# Patient Record
Sex: Male | Born: 1970 | Race: Asian | Hispanic: No | Marital: Married | Smoking: Never smoker
Health system: Southern US, Community
[De-identification: ages and names within clinical notes are randomized; demographics above are authoritative.]

## PROBLEM LIST (undated history)

## (undated) DIAGNOSIS — J301 Allergic rhinitis due to pollen: Secondary | ICD-10-CM

## (undated) DIAGNOSIS — I10 Essential (primary) hypertension: Secondary | ICD-10-CM

## (undated) DIAGNOSIS — E785 Hyperlipidemia, unspecified: Secondary | ICD-10-CM

## (undated) HISTORY — PX: WISDOM TOOTH EXTRACTION: SHX21

## (undated) HISTORY — DX: Essential (primary) hypertension: I10

## (undated) HISTORY — DX: Allergic rhinitis due to pollen: J30.1

## (undated) HISTORY — DX: Hyperlipidemia, unspecified: E78.5

---

## 2008-01-22 ENCOUNTER — Ambulatory Visit: Payer: Self-pay | Admitting: Family Medicine

## 2008-01-22 DIAGNOSIS — E785 Hyperlipidemia, unspecified: Secondary | ICD-10-CM | POA: Insufficient documentation

## 2008-01-22 DIAGNOSIS — IMO0002 Reserved for concepts with insufficient information to code with codable children: Secondary | ICD-10-CM

## 2008-01-22 DIAGNOSIS — R29898 Other symptoms and signs involving the musculoskeletal system: Secondary | ICD-10-CM

## 2008-01-22 DIAGNOSIS — I10 Essential (primary) hypertension: Secondary | ICD-10-CM | POA: Insufficient documentation

## 2008-01-22 DIAGNOSIS — K8020 Calculus of gallbladder without cholecystitis without obstruction: Secondary | ICD-10-CM | POA: Insufficient documentation

## 2008-02-27 ENCOUNTER — Ambulatory Visit: Payer: Self-pay | Admitting: Family Medicine

## 2008-02-27 DIAGNOSIS — M25559 Pain in unspecified hip: Secondary | ICD-10-CM | POA: Insufficient documentation

## 2008-02-27 LAB — CONVERTED CEMR LAB: LDL Cholesterol: 97 mg/dL

## 2008-03-01 LAB — CONVERTED CEMR LAB
Albumin: 3.9 g/dL (ref 3.5–5.2)
BUN: 15 mg/dL (ref 6–23)
Calcium: 9.2 mg/dL (ref 8.4–10.5)
Direct LDL: 96.1 mg/dL
GFR calc Af Amer: 98 mL/min
Glucose, Bld: 102 mg/dL — ABNORMAL HIGH (ref 70–99)
HDL: 38.8 mg/dL — ABNORMAL LOW (ref >39.0)
Total Protein: 7.3 g/dL (ref 6.0–8.3)
Triglycerides: 307 mg/dL (ref 0–149)
VLDL: 61 mg/dL — ABNORMAL HIGH (ref 0–40)

## 2008-03-05 ENCOUNTER — Telehealth (INDEPENDENT_AMBULATORY_CARE_PROVIDER_SITE_OTHER): Payer: Self-pay | Admitting: *Deleted

## 2008-04-03 ENCOUNTER — Ambulatory Visit: Payer: Self-pay | Admitting: Family Medicine

## 2008-04-03 DIAGNOSIS — J069 Acute upper respiratory infection, unspecified: Secondary | ICD-10-CM | POA: Insufficient documentation

## 2008-09-05 ENCOUNTER — Ambulatory Visit: Payer: Self-pay | Admitting: Family Medicine

## 2008-09-05 LAB — CONVERTED CEMR LAB
ALT: 33 units/L (ref 0–53)
AST: 25 units/L (ref 0–37)
Alkaline Phosphatase: 49 units/L (ref 39–117)
Bilirubin, Direct: 0 mg/dL (ref 0.0–0.3)
Total Protein: 7.4 g/dL (ref 6.0–8.3)

## 2009-09-30 ENCOUNTER — Ambulatory Visit: Payer: Self-pay | Admitting: Family Medicine

## 2009-09-30 LAB — CONVERTED CEMR LAB
ALT: 23 units/L (ref 0–53)
AST: 21 units/L (ref 0–37)
Albumin: 4.1 g/dL (ref 3.5–5.2)
Alkaline Phosphatase: 53 units/L (ref 39–117)
Basophils Relative: 0.5 % (ref 0.0–3.0)
CO2: 29 meq/L (ref 19–32)
Calcium: 9.5 mg/dL (ref 8.4–10.5)
Chloride: 100 meq/L (ref 96–112)
Eosinophils Absolute: 0.1 10*3/uL (ref 0.0–0.7)
Hemoglobin: 14.6 g/dL (ref 13.0–17.0)
Lymphocytes Relative: 39.1 % (ref 12.0–46.0)
MCHC: 33.9 g/dL (ref 30.0–36.0)
Neutro Abs: 3.4 10*3/uL (ref 1.4–7.7)
RBC: 4.98 M/uL (ref 4.22–5.81)
Sodium: 139 meq/L (ref 135–145)
Total CHOL/HDL Ratio: 5
Total Protein: 7.3 g/dL (ref 6.0–8.3)
Triglycerides: 207 mg/dL — ABNORMAL HIGH (ref 0.0–149.0)

## 2009-10-06 ENCOUNTER — Ambulatory Visit: Payer: Self-pay | Admitting: Family Medicine

## 2009-11-04 ENCOUNTER — Encounter: Payer: Self-pay | Admitting: Family Medicine

## 2010-01-21 ENCOUNTER — Ambulatory Visit: Payer: Self-pay | Admitting: Internal Medicine

## 2010-01-21 DIAGNOSIS — B309 Viral conjunctivitis, unspecified: Secondary | ICD-10-CM | POA: Insufficient documentation

## 2010-06-02 NOTE — Miscellaneous (Signed)
  Clinical Lists Changes  Medications: Added new medication of FLONASE 50 MCG/ACT SUSP (FLUTICASONE PROPIONATE) 2 sprays in each nostril once daily - Signed Rx of FLONASE 50 MCG/ACT SUSP (FLUTICASONE PROPIONATE) 2 sprays in each nostril once daily;  #3 x 3;  Signed;  Entered by: Benny Lennert CMA (AAMA);  Authorized by: Hannah Beat MD;  Method used: Electronically to [facility] [address], [place], [place], [place]  [address], Ph: [phone], Fax: [phone]    Prescriptions: FLONASE 50 MCG/ACT SUSP (FLUTICASONE PROPIONATE) 2 sprays in each nostril once daily  #3 x 3   Entered by:   Benny Lennert CMA (AAMA)   Authorized by:   Hannah Beat MD   Signed by:   Benny Lennert CMA (AAMA) on 11/04/2009   Method used:   Electronically to        [facility] [address] (retail)       [address], [address]       [place], [place]  [address]       Ph: [phone]       Fax: [phone]   RxID:   [id number]   Prior Medications: PRAVASTATIN SODIUM 80 MG  TABS (PRAVASTATIN SODIUM) one by mouth at bedtime AMLODIPINE BESYLATE 10 MG TABS (AMLODIPINE BESYLATE) 1 by mouth daily Current Allergies: ! HYDROCODONE ACE INHIBITORS

## 2010-06-02 NOTE — Assessment & Plan Note (Signed)
Summary: ?SINUS INFECTION,PINK EYE/CLE   Vital Signs:  Patient profile:   40 year old male Weight:      210 pounds Temp:     98.3 degrees F oral Pulse rate:   96 / minute Pulse rhythm:   regular BP sitting:   124 / 80  (left arm) Cuff size:   large  Vitals Entered By: Selena Batten Dance CMA Duncan Dull) (January 21, 2010 12:31 PM) CC: ? Sinus Infection/? Pink eye   History of Present Illness: CC: feeling bad this morning  3d h/o Red left eye, this am swollen, discharge.  Also feeling lots of drainage down back of throat.  Not feeling well.  + cough, productive.  Possible fever last night - awoke sweating.  Mild diarrhea.  Taking OTC pseudophed. This AM took advil.  Worse in am and at night.    No abd pain, n/v.  No h/o asthma.  No smokers at home.  + 23 year old son, in daycare.  pink eye there.  Current Medications (verified): 1)  Pravastatin Sodium 80 Mg  Tabs (Pravastatin Sodium) .... One By Mouth At Bedtime 2)  Amlodipine Besylate 10 Mg Tabs (Amlodipine Besylate) .Marland Kitchen.. 1 By Mouth Daily 3)  Flonase 50 Mcg/act Susp (Fluticasone Propionate) .... 2 Sprays in Each Nostril Once Daily  Allergies: 1)  ! Hydrocodone 2)  Ace Inhibitors  Past History:  Past Medical History: elevated BP Hyperlipidemia Low back pain Piriformis syndrome Hypertension Allergic rhinitis  Review of Systems       per HPI  Physical Exam  General:  Well-developed,well-nourished,in no acute distress; alert,appropriate and cooperative throughout examination Head:  Normocephalic and atraumatic without obvious abnormalities. No apparent alopecia or balding. Eyes:  No corneal or conjunctival inflammation noted. EOMI. Perrla. Vision grossly normal. Ears:  External ear exam shows no significant lesions or deformities.  Otoscopic examination reveals clear canals, tympanic membranes are intact bilaterally without bulging, retraction, inflammation or discharge. Hearing is grossly normal bilaterally. Nose:  External  nasal examination shows no deformity or inflammation. Nasal mucosa are pink and moist without lesions or exudates. Mouth:  erythematous pharynx Neck:  No deformities, masses, or tenderness noted. Lungs:  Normal respiratory effort, chest expands symmetrically. Lungs are clear to auscultation, no crackles or wheezes. Heart:  Normal rate and regular rhythm. S1 and S2 normal without gallop, murmur, click, rub or other extra sounds. Abdomen:  Bowel sounds positive,abdomen soft and non-tender without masses, organomegaly or hernias noted. Pulses:  2+ rad pulses Extremities:  no edema Skin:  Intact without suspicious lesions or rashes   Impression & Recommendations:  Problem # 1:  URI (ICD-465.9) Instructed on symptomatic treatment. Call if symptoms persist or worsen.   NSAIDs for throat, guaifenesin to help cough up mucous.  Expected course of illness discussed, red flags to return discussed.  Problem # 2:  CONJUNCTIVITIS, VIRAL, LEFT (ICD-077.99) symptomatic treatment, warm compresses, return if not improving.  Complete Medication List: 1)  Pravastatin Sodium 80 Mg Tabs (Pravastatin sodium) .... One by mouth at bedtime 2)  Amlodipine Besylate 10 Mg Tabs (Amlodipine besylate) .Marland Kitchen.. 1 by mouth daily 3)  Flonase 50 Mcg/act Susp (Fluticasone propionate) .... 2 sprays in each nostril once daily  Patient Instructions: 1)  Sounds like you have a viral upper respiratory infection. 2)  Antibiotics are not needed for this.  Viral infections usually take 7-10 days to resolve.  The cough can last up to 4-6 weeks to go away. 3)  For eye - warm compresses 3 times  a day. 4)  Push fluids and rest. 5)  Guaifenesin 400mg  IR 1 1/2 pills in am and at noon, take with plenty of fluid.   6)  Please return if you are not improving as expected, or if you have high fevers (>101.5) or difficulty swallowing. 7)  Call clinic with questions.  Pleasure to see you today!   Current Allergies (reviewed today): !  HYDROCODONE ACE INHIBITORS

## 2010-06-02 NOTE — Assessment & Plan Note (Signed)
Summary: CPX/CLE   Vital Signs:  Patient profile:   40 year old male Height:      68 inches Weight:      218.0 pounds BMI:     33.27 Temp:     98.7 degrees F oral Pulse rate:   68 / minute Pulse rhythm:   regular BP sitting:   138 / 80  (left arm) Cuff size:   large  Vitals Entered By: Benny Lennert CMA Duncan Dull) (October 06, 2009 2:30 PM)  History of Present Illness: Chief complaint cpx  40 year old male:  intermittent constipation and diarrhea having some gi issues, stomache ache  constipated for a a long time on and off for a year.   ACE COUGH  Preventive Screening-Counseling & Management  Alcohol-Tobacco     Alcohol drinks/day: <1     Alcohol Counseling: not indicated; use of alcohol is not excessive or problematic     Smoking Status: never     Tobacco Counseling: not indicated; no tobacco use  Caffeine-Diet-Exercise     Diet Comments: poor     Diet Counseling: to improve diet; diet is suboptimal     Does Patient Exercise: yes     Exercise Counseling: to improve exercise regimen  Hep-HIV-STD-Contraception     HIV Risk: no risk noted     STD Risk: no risk noted     Testicular SE Education/Counseling to perform regular STE      Sexual History:  currently monogamous.        Drug Use:  never.    Clinical Review Panels:  Lipid Management   Cholesterol:  200 (09/30/2009)   LDL (bad choesterol):  97 (02/27/2008)   HDL (good cholesterol):  44.20 (09/30/2009)  CBC   WBC:  6.6 (09/30/2009)   RBC:  4.98 (09/30/2009)   Hgb:  14.6 (09/30/2009)   Hct:  42.9 (09/30/2009)   Platelets:  194.0 (09/30/2009)   MCV  86.2 (09/30/2009)   MCHC  33.9 (09/30/2009)   RDW  13.4 (09/30/2009)   PMN:  51.0 (09/30/2009)   Lymphs:  39.1 (09/30/2009)   Monos:  7.7 (09/30/2009)   Eosinophils:  1.7 (09/30/2009)   Basophil:  0.5 (09/30/2009)  Complete Metabolic Panel   Glucose:  92 (09/30/2009)   Sodium:  139 (09/30/2009)   Potassium:  4.1 (09/30/2009)   Chloride:  100  (09/30/2009)   CO2:  29 (09/30/2009)   BUN:  22 (09/30/2009)   Creatinine:  1.0 (09/30/2009)   Albumin:  4.1 (09/30/2009)   Total Protein:  7.3 (09/30/2009)   Calcium:  9.5 (09/30/2009)   Total Bili:  0.7 (09/30/2009)   Alk Phos:  53 (09/30/2009)   SGPT (ALT):  23 (09/30/2009)   SGOT (AST):  21 (09/30/2009)   Allergies: 1)  ! Hydrocodone 2)  Ace Inhibitors  Past History:  Past medical, surgical, family and social histories (including risk factors) reviewed, and no changes noted (except as noted below).  Past Medical History: Reviewed history from 02/27/2008 and no changes required. elevated BP Hyperlipidemia Low back pain Piriformis syndrome Hypertension  Family History: Reviewed history from 01/22/2008 and no changes required. Adopted  Social History: Reviewed history from 01/22/2008 and no changes required. Married Wife pregnant Works at [employer], PTA No tob Alcohol use-yes Drug use-no Regular exercise-yes Smoking Status:  never STD Risk:  no risk noted HIV Risk:  no risk noted Sexual History:  currently monogamous Drug Use:  never  Review of Systems  General: Denies fever, chills,  sweats, anorexia, fatigue, weakness, malaise Eyes: Denies blurring, vision loss ENT: Denies earache, nasal congestion, nosebleeds, sore throat, and hoarseness.  Cardiovascular: Denies chest pains, palpitations, syncope, dyspnea on exertion,  Respiratory: Denies cough, dyspnea at rest, excessive sputum,wheeezing GI: AS ABOVE GU: Denies dysuria, hematuria, discharge, urinary frequency, urinary hesitancy, nocturia, incontinence, genital sores, decreased libido Musculoskeletal: Denies back pain, joint pain Derm: Denies rash, itching Neuro: Denies  paresthesias, frequent falls, frequent headaches, and difficulty walking.  Psych: Denies depression, anxiety Endocrine: Denies cold intolerance, heat intolerance, polydipsia, polyphagia, polyuria, and unusual weight change.  Heme:  Denies enlarged lymph nodes Allergy: No hayfever   Otherwise, the pertinent positives and negatives are listed above and in the HPI, otherwise a full review of systems has been reviewed and is negative unless noted positive.   Physical Exam  General:  Well-developed,well-nourished,in no acute distress; alert,appropriate and cooperative throughout examination Head:  Normocephalic and atraumatic without obvious abnormalities. No apparent alopecia or balding. Eyes:  No corneal or conjunctival inflammation noted. EOMI. Perrla. Vision grossly normal. Ears:  External ear exam shows no significant lesions or deformities.  Otoscopic examination reveals clear canals, tympanic membranes are intact bilaterally without bulging, retraction, inflammation or discharge. Hearing is grossly normal bilaterally. Nose:  External nasal examination shows no deformity or inflammation. Nasal mucosa are pink and moist without lesions or exudates. Mouth:  Oral mucosa and oropharynx without lesions or exudates.  Teeth in good repair. Neck:  No deformities, masses, or tenderness noted. Chest Wall:  No deformities, masses, tenderness or gynecomastia noted. Lungs:  Normal respiratory effort, chest expands symmetrically. Lungs are clear to auscultation, no crackles or wheezes. Heart:  Normal rate and regular rhythm. S1 and S2 normal without gallop, murmur, click, rub or other extra sounds. Abdomen:  Bowel sounds positive,abdomen soft and non-tender without masses, organomegaly or hernias noted. Genitalia:  Testes bilaterally descended without nodularity, tenderness or masses. No scrotal masses or lesions. No penis lesions or urethral discharge. Msk:  normal ROM and no crepitation.   Extremities:  No clubbing, cyanosis, edema, or deformity noted with normal full range of motion of all joints.   Neurologic:  alert & oriented X3 and gait normal.   Skin:  Intact without suspicious lesions or rashes Cervical Nodes:  No  lymphadenopathy noted Psych:  Cognition and judgment appear intact. Alert and cooperative with normal attention span and concentration. No apparent delusions, illusions, hallucinations   Impression & Recommendations:  Problem # 1:  HEALTH MAINTENANCE EXAM (ICD-V70.0) The patient's preventative maintenance and recommended screening tests for an annual wellness exam were reviewed in full today. Brought up to date unless services declined.  Counselled on the importance of diet, exercise, and its role in overall health and mortality. The patient's FH and SH was reviewed, including their home life, tobacco status, and drug and alcohol status.   Problem # 2:  HYPERTENSION (ICD-401.9) change to generic norvasc - probable constipation from verapamil  The following medications were removed from the medication list:    Verapamil Hcl Cr 360 Mg Cp24 (Verapamil hcl) .Marland Kitchen... 1 by mouth daily His updated medication list for this problem includes:    Amlodipine Besylate 10 Mg Tabs (Amlodipine besylate) .Marland Kitchen... 1 by mouth daily  Complete Medication List: 1)  Pravastatin Sodium 80 Mg Tabs (Pravastatin sodium) .... One by mouth at bedtime 2)  Amlodipine Besylate 10 Mg Tabs (Amlodipine besylate) .Marland Kitchen.. 1 by mouth daily Prescriptions: AMLODIPINE BESYLATE 10 MG TABS (AMLODIPINE BESYLATE) 1 by mouth daily  #90 x 3  Entered and Authorized by:   Hannah Beat MD   Signed by:   Hannah Beat MD on 10/06/2009   Method used:   Print then Give to Patient   RxID:   [id number] PRAVASTATIN SODIUM 80 MG  TABS (PRAVASTATIN SODIUM) one by mouth at bedtime  #90 x 3   Entered and Authorized by:   Hannah Beat MD   Signed by:   Hannah Beat MD on 10/06/2009   Method used:   Electronically to        [facility] [address] ([facility]ail)       [address], [address]       [place], [place]  [address]       Ph: [phone]       Fax: [phone]   RxID:    [id number] AMLODIPINE BESYLATE 10 MG TABS (AMLODIPINE BESYLATE) 1 by mouth daily  #30 x 11   Entered and Authorized by:   Hannah Beat MD   Signed by:   Hannah Beat MD on 10/06/2009   Method used:   Electronically to        [facility] [address] (retail)       [address], [address]       [place], [place]  [address]       Ph: [phone]       Fax: [phone]   RxID:   [id number]   Current Allergies (reviewed today): ! HYDROCODONE ACE INHIBITORS

## 2010-10-26 ENCOUNTER — Other Ambulatory Visit: Payer: Self-pay | Admitting: Family Medicine

## 2010-11-02 ENCOUNTER — Encounter: Payer: Self-pay | Admitting: Family Medicine

## 2010-11-05 ENCOUNTER — Encounter: Payer: Self-pay | Admitting: Family Medicine

## 2010-11-05 ENCOUNTER — Ambulatory Visit (INDEPENDENT_AMBULATORY_CARE_PROVIDER_SITE_OTHER): Payer: 59 | Admitting: Family Medicine

## 2010-11-05 DIAGNOSIS — E785 Hyperlipidemia, unspecified: Secondary | ICD-10-CM

## 2010-11-05 DIAGNOSIS — I10 Essential (primary) hypertension: Secondary | ICD-10-CM

## 2010-11-05 DIAGNOSIS — Z131 Encounter for screening for diabetes mellitus: Secondary | ICD-10-CM

## 2010-11-05 DIAGNOSIS — J301 Allergic rhinitis due to pollen: Secondary | ICD-10-CM

## 2010-11-05 LAB — LDL CHOLESTEROL, DIRECT: Direct LDL: 108.7 mg/dL

## 2010-11-05 LAB — GLUCOSE, RANDOM: Glucose, Bld: 89 mg/dL (ref 70–99)

## 2010-11-05 MED ORDER — ATORVASTATIN CALCIUM 80 MG PO TABS: 80.0000 mg | ORAL_TABLET | Freq: Every day | ORAL | Status: DC

## 2010-11-05 MED ORDER — PRAVASTATIN SODIUM 80 MG PO TABS: 80.0000 mg | ORAL_TABLET | Freq: Every day | ORAL | Status: DC

## 2010-11-05 MED ORDER — AMLODIPINE BESYLATE 10 MG PO TABS: 10.0000 mg | ORAL_TABLET | Freq: Every day | ORAL | Status: DC

## 2010-11-05 MED ORDER — ROSUVASTATIN CALCIUM 40 MG PO TABS: 40.0000 mg | ORAL_TABLET | Freq: Every day | ORAL | Status: DC

## 2010-11-05 MED ORDER — FLUTICASONE PROPIONATE 50 MCG/ACT NA SUSP: 2.0000 | Freq: Every day | NASAL | Status: DC

## 2010-11-05 NOTE — Progress Notes (Signed)
Alex Anderson, a 40 y.o. male presents today in the office for the following:   Still working at [employer]   HTN: Tolerating all medications without side effects Stable and at goal No CP, no sob. No HA.  BP Readings from Last 3 Encounters:  11/05/10 140/90  01/21/10 124/80  10/06/09 138/80    Basic Metabolic Panel:    Component Value Date/Time   NA 139 09/30/2009 1158   K 4.1 09/30/2009 1158   CL 100 09/30/2009 1158   CO2 29 09/30/2009 1158   BUN 22 09/30/2009 1158   CREATININE 1.0 09/30/2009 1158   GLUCOSE 89 11/05/2010 1314   CALCIUM 9.5 09/30/2009 1158    125/84 on my recheck.  Lipids: Doing well Tolerating meds fine with no SE.  Lipids:    Component Value Date/Time   CHOL 210* 11/05/2010 1314   TRIG 338.0* 11/05/2010 1314   HDL 51.50 11/05/2010 1314   LDLDIRECT 108.7 11/05/2010 1314   VLDL 67.6* 11/05/2010 1314   CHOLHDL 4 11/05/2010 1314    Lab Results  Component Value Date   ALT 23 09/30/2009   AST 21 09/30/2009   ALKPHOS 53 09/30/2009   BILITOT 0.7 09/30/2009   Patient Active Problem List  Diagnoses  . HYPERLIPIDEMIA  . HYPERTENSION  . CHOLELITHIASIS  . BACK PAIN, LUMBAR, WITH RADICULOPATHY  . Screening for diabetes mellitus   Past Medical History  Diagnosis Date  . Hyperlipidemia   . Hypertension   . Allergic rhinitis due to pollen     allergic rhinitis   No past surgical history on file. History  Substance Use Topics  . Smoking status: Never Smoker   . Smokeless tobacco: Not on file  . Alcohol Use: Yes   Family History  Problem Relation Age of Onset  . Adopted: Yes   Allergies  Allergen Reactions  . Ace Inhibitors     REACTION: cough  . Hydrocodone    No current outpatient prescriptions on file prior to visit.   ROS: GEN: No acute illnesses, no fevers, chills. GI: No n/v/d, eating normally Pulm: No SOB Interactive and getting along well at home.  Otherwise, ROS is as per the HPI.   Physical Exam  Blood pressure 140/90, pulse 77, temperature  98.6 F (37 C), temperature source Oral, weight 215 lb (97.523 kg).  GEN: WDWN, NAD, Non-toxic, A & O x 3 HEENT: Atraumatic, Normocephalic. Neck supple. No masses, No LAD. Ears and Nose: No external deformity. CV: RRR, No M/G/R. No JVD. No thrill. No extra heart sounds. PULM: CTA B, no wheezes, crackles, rhonchi. No retractions. No resp. distress. No accessory muscle use. ABD: S, NT, ND, +BS. No rebound tenderness. No HSM.  EXTR: No c/c/e NEURO Normal gait.  PSYCH: Normally interactive. Conversant. Not depressed or anxious appearing.  Calm demeanor.   Assessment and Plan: 1.  HTN, refill meds 2. Allergic rhinitis, refill meds 3. Hyperlipidemia, unstable: discontinue Pravachol. Called and discussed laboratory results with the patient, and we're going to place him on either Crestor 40 mg or Lipitor 80. I asked him to check with his insurance to see which one would be cheaper, and I mailed him a prescription for both

## 2010-11-06 ENCOUNTER — Encounter: Payer: Self-pay | Admitting: Family Medicine

## 2010-11-06 DIAGNOSIS — J301 Allergic rhinitis due to pollen: Secondary | ICD-10-CM | POA: Insufficient documentation

## 2010-11-11 ENCOUNTER — Telehealth: Payer: Self-pay | Admitting: *Deleted

## 2010-11-11 NOTE — Telephone Encounter (Signed)
Pt was changed from pravastatin to atorvastatin but he cant afford this.  The atorvastatin is costing him much more than the pravastatin did.  He needs to change to something else.  Uses [facility] [address].  Please let pt know.

## 2010-11-11 NOTE — Telephone Encounter (Signed)
He can remain on Pravastatin 80 mg, 1 po qhs. #90, 3 refills  It will work ok, finances can be an issue. The best thing he could do overall for his health and cholesterol is lose weight and get in better shape.   Let him know and send into pharmacy Call --- I want you to call and speak to this patient directly, not a message and not on phone tree

## 2010-11-12 MED ORDER — PRAVASTATIN SODIUM 80 MG PO TABS: 80.0000 mg | ORAL_TABLET | Freq: Every evening | ORAL | Status: DC

## 2010-11-12 NOTE — Telephone Encounter (Signed)
Left message on machine for patient to return my call 

## 2010-11-13 NOTE — Telephone Encounter (Signed)
Patient advised and rx sent to [facility] [address]

## 2010-12-09 ENCOUNTER — Ambulatory Visit (INDEPENDENT_AMBULATORY_CARE_PROVIDER_SITE_OTHER): Payer: 59 | Admitting: Family Medicine

## 2010-12-09 ENCOUNTER — Encounter: Payer: Self-pay | Admitting: Family Medicine

## 2010-12-09 VITALS — BP 130/80 | HR 89 | Temp 99.0°F | Ht 68.0 in | Wt 214.8 lb

## 2010-12-09 DIAGNOSIS — J189 Pneumonia, unspecified organism: Secondary | ICD-10-CM

## 2010-12-09 DIAGNOSIS — J157 Pneumonia due to Mycoplasma pneumoniae: Secondary | ICD-10-CM

## 2010-12-09 MED ORDER — AZITHROMYCIN 250 MG PO TABS: ORAL_TABLET | ORAL | Status: DC

## 2010-12-09 NOTE — Progress Notes (Signed)
  Subjective:    Patient ID: Alex Anderson, male    DOB: [birth year], 40 y.o.   MRN: [id number]  HPI  Acute Bronchitis: Patient presents for presents evaluation of productive cough. Symptoms began 3 weeks ago and are gradually improving since that time.  Past history is significant for no history of pneumonia or bronchitis.  Has done some over the counter remedies.  2 1/2 weeks -- coughing and will not go away. Has been getting better. Has been taking sudafed, mucinex, nasal saline.  Caught from son a few weeks ago.  The PMH, PSH, Social History, Family History, Medications, and allergies have been reviewed in [facility], and have been updated if relevant.  Review of Systems ROS: GEN: Acute illness details above GI: Tolerating PO intake GU: maintaining adequate hydration and urination Pulm: No SOB Interactive and getting along well at home.  Otherwise, ROS is as per the HPI.     Objective:   Physical Exam   Physical Exam  Blood pressure 130/80, pulse 89, temperature 99 F (37.2 C), temperature source Oral, height 5\' 8"  (1.727 m), weight 214 lb 12.8 oz (97.433 kg), SpO2 98.00%.  GEN: A and O x 3. WDWN. NAD.    ENT: Nose clear, ext NML.  No LAD.  No JVD.  TM's clear. Oropharynx clear.  PULM: Normal WOB, no distress. No crackles, wheezes, rhonchi. CV: RRR, no M/G/R, No rubs, No JVD.   ABD: S, NT, ND, + BS. No rebound. No guarding. No HSM.   EXT: warm and well-perfused, No c/c/e. PSYCH: Pleasant and conversant.     Assessment & Plan:   1. Walking pneumonia    Given length of symptoms, will cover for atypicals and pertussis with z-pak. And cont supportive care

## 2010-12-23 ENCOUNTER — Telehealth: Payer: Self-pay | Admitting: *Deleted

## 2010-12-23 MED ORDER — DOXYCYCLINE HYCLATE 100 MG PO TABS: 100.0000 mg | ORAL_TABLET | Freq: Two times a day (BID) | ORAL | Status: AC

## 2010-12-23 NOTE — Telephone Encounter (Signed)
i would probably change up   Doxycycline 100 mg, 1 po bid x 10 days, #20 Send in  Some of it could be post-infectious lung inflammation, you can get a cough for a couple of weeks being sick.  Seems reasonable to do another round of ABX

## 2010-12-23 NOTE — Telephone Encounter (Signed)
Pt was seen a couple of weeks ago and diagnosed with URI. He was given z-pack, which he finished.  He still has a cough with some yellow mucous.  No fever, SOB or wheezing.  Feels ok.  He is asking if he should take another course of zithromax.  [facility]ses [facility] [address].

## 2010-12-23 NOTE — Telephone Encounter (Signed)
Patient advised.

## 2011-12-13 ENCOUNTER — Other Ambulatory Visit: Payer: Self-pay | Admitting: Family Medicine

## 2011-12-13 NOTE — Telephone Encounter (Signed)
Pt called re: refills on amlodipine and pravastatin to [facility] [address]. Pt scheduled f/u 12/20/11.Please advise.

## 2011-12-13 NOTE — Telephone Encounter (Signed)
Patient has appt on 12-20-2011 gave 30 with 0

## 2011-12-20 ENCOUNTER — Ambulatory Visit (INDEPENDENT_AMBULATORY_CARE_PROVIDER_SITE_OTHER): Payer: 59 | Admitting: Family Medicine

## 2011-12-20 ENCOUNTER — Encounter: Payer: Self-pay | Admitting: Family Medicine

## 2011-12-20 VITALS — BP 108/78 | HR 76 | Temp 98.5°F | Ht 68.0 in | Wt 212.5 lb

## 2011-12-20 DIAGNOSIS — E785 Hyperlipidemia, unspecified: Secondary | ICD-10-CM

## 2011-12-20 DIAGNOSIS — I10 Essential (primary) hypertension: Secondary | ICD-10-CM

## 2011-12-20 DIAGNOSIS — Z79899 Other long term (current) drug therapy: Secondary | ICD-10-CM

## 2011-12-20 LAB — LIPID PANEL
Cholesterol: 210 mg/dL — ABNORMAL HIGH (ref 0–200)
VLDL: 26.8 mg/dL (ref 0.0–40.0)

## 2011-12-20 LAB — BASIC METABOLIC PANEL
BUN: 20 mg/dL (ref 6–23)
GFR: 76.74 mL/min (ref >60.00)
Glucose, Bld: 90 mg/dL (ref 70–99)
Potassium: 4 mEq/L (ref 3.5–5.1)

## 2011-12-20 LAB — HEPATIC FUNCTION PANEL
ALT: 33 U/L (ref 0–53)
AST: 25 U/L (ref 0–37)
Albumin: 4.4 g/dL (ref 3.5–5.2)

## 2011-12-20 MED ORDER — AMLODIPINE BESYLATE 10 MG PO TABS: 10.0000 mg | ORAL_TABLET | Freq: Every day | ORAL | Status: DC

## 2011-12-20 MED ORDER — PRAVASTATIN SODIUM 80 MG PO TABS: 80.0000 mg | ORAL_TABLET | Freq: Every day | ORAL | Status: DC

## 2011-12-20 NOTE — Progress Notes (Signed)
Nature conservation officer at [facility] [address] [place] [address] Phone: [phone] Fax: [phone]  Date:  12/20/2011   Name:  Alex Anderson   DOB:  03-17-1971   MRN:  [id number] Gender: male  Age: 41 y.o.  PCP:  Hannah Beat, MD    Chief Complaint: Medication Refill   History of Present Illness:  Alex Anderson is a 41 y.o. pleasant patient who presents with the following:  Lipids: Doing well, stable. Tolerating meds fine with no SE. Panel reviewed with patient.  Lipids:    Component Value Date/Time   CHOL 210* 11/05/2010 1314   TRIG 338.0* 11/05/2010 1314   HDL 51.50 11/05/2010 1314   LDLDIRECT 108.7 11/05/2010 1314   VLDL 67.6* 11/05/2010 1314   CHOLHDL 4 11/05/2010 1314    Lab Results  Component Value Date   ALT 23 09/30/2009   AST 21 09/30/2009   ALKPHOS 53 09/30/2009   BILITOT 0.7 09/30/2009   HTN: Tolerating all medications without side effects Stable and at goal No CP, no sob. No HA.  BP Readings from Last 3 Encounters:  12/20/11 108/78  12/09/10 130/80  11/05/10 140/90    Basic Metabolic Panel:    Component Value Date/Time   NA 139 09/30/2009 1158   K 4.1 09/30/2009 1158   CL 100 09/30/2009 1158   CO2 29 09/30/2009 1158   BUN 22 09/30/2009 1158   CREATININE 1.0 09/30/2009 1158   GLUCOSE 89 11/05/2010 1314   CALCIUM 9.5 09/30/2009 1158      Patient Active Problem List  Diagnosis  . HYPERLIPIDEMIA  . HYPERTENSION  . Screening for diabetes mellitus  . Allergic rhinitis due to pollen    Past Medical History  Diagnosis Date  . Hyperlipidemia   . Hypertension   . Allergic rhinitis due to pollen     allergic rhinitis    No past surgical history on file.  History  Substance Use Topics  . Smoking status: Never Smoker   . Smokeless tobacco: Never Used  . Alcohol Use: No    Family History  Problem Relation Age of Onset  . Adopted: Yes    Allergies  Allergen Reactions  . Ace Inhibitors     REACTION: cough  . Hydrocodone      Medication list has been reviewed and updated.  Current Outpatient Prescriptions on File Prior to Visit  Medication Sig Dispense Refill  . amLODipine (NORVASC) 10 MG tablet TAKE ONE TABLET BY MOUTH EVERY DAY  30 tablet  0  . fluticasone (FLONASE) 50 MCG/ACT nasal spray Place 2 sprays into the nose as needed.        . pravastatin (PRAVACHOL) 80 MG tablet TAKE ONE TABLET BY MOUTH IN THE EVENING  30 tablet  0  . DISCONTD: amLODipine (NORVASC) 10 MG tablet Take 1 tablet (10 mg total) by mouth daily.  90 tablet  3    Review of Systems:   GEN: No acute illnesses, no fevers, chills. GI: No n/v/d, eating normally Pulm: No SOB Interactive and getting along well at home.  Otherwise, ROS is as per the HPI.   Physical Examination: Filed Vitals:   12/20/11 1108  BP: 108/78  Pulse: 76  Temp: 98.5 F (36.9 C)   Filed Vitals:   12/20/11 1108  Height: 5\' 8"  (1.727 m)  Weight: 212 lb 8 oz (96.389 kg)   Body mass index is 32.31 kg/(m^2). Ideal Body Weight: Weight in (lb) to have BMI = 25: 164.1  GEN: WDWN, NAD, Non-toxic, A & O x 3 HEENT: Atraumatic, Normocephalic. Neck supple. No masses, No LAD. Ears and Nose: No external deformity. CV: RRR, No M/G/R. No JVD. No thrill. No extra heart sounds. PULM: CTA B, no wheezes, crackles, rhonchi. No retractions. No resp. distress. No accessory muscle use. EXTR: No c/c/e NEURO Normal gait.  PSYCH: Normally interactive. Conversant. Not depressed or anxious appearing.  Calm demeanor.    Assessment and Plan: 1. Other and unspecified hyperlipidemia  pravastatin (PRAVACHOL) 80 MG tablet, Lipid panel  2. Hypertension  amLODipine (NORVASC) 10 MG tablet  3. Encounter for long-term (current) use of other medications  Basic metabolic panel, Hepatic function panel    Doing well, refill meds and check labs  Hannah Beat, MD

## 2011-12-21 ENCOUNTER — Encounter: Payer: Self-pay | Admitting: *Deleted

## 2012-12-02 ENCOUNTER — Other Ambulatory Visit: Payer: Self-pay | Admitting: Family Medicine

## 2012-12-29 ENCOUNTER — Other Ambulatory Visit: Payer: Self-pay | Admitting: Family Medicine

## 2012-12-29 MED ORDER — PRAVASTATIN SODIUM 80 MG PO TABS: ORAL_TABLET | ORAL | Status: DC

## 2012-12-29 NOTE — Telephone Encounter (Signed)
Appointment scheduled for CPE  02/26/2013.  Notified he must keep this appointment to get any additional refills.

## 2012-12-29 NOTE — Telephone Encounter (Signed)
Spoke to patient to schedule a physical. Patient states that he he also needs a refill on his Pravastatin.

## 2013-02-26 ENCOUNTER — Ambulatory Visit (INDEPENDENT_AMBULATORY_CARE_PROVIDER_SITE_OTHER): Payer: 59 | Admitting: Family Medicine

## 2013-02-26 ENCOUNTER — Encounter: Payer: Self-pay | Admitting: Family Medicine

## 2013-02-26 ENCOUNTER — Encounter (INDEPENDENT_AMBULATORY_CARE_PROVIDER_SITE_OTHER): Payer: Self-pay

## 2013-02-26 VITALS — BP 124/96 | HR 79 | Temp 98.6°F | Ht 67.75 in | Wt 213.5 lb

## 2013-02-26 DIAGNOSIS — Z0000 Encounter for general adult medical examination without abnormal findings: Secondary | ICD-10-CM

## 2013-02-26 DIAGNOSIS — E785 Hyperlipidemia, unspecified: Secondary | ICD-10-CM

## 2013-02-26 DIAGNOSIS — Z79899 Other long term (current) drug therapy: Secondary | ICD-10-CM

## 2013-02-26 LAB — CBC WITH DIFFERENTIAL/PLATELET
Basophils Relative: 0.6 % (ref 0.0–3.0)
Eosinophils Absolute: 0.1 10*3/uL (ref 0.0–0.7)
Eosinophils Relative: 1.3 % (ref 0.0–5.0)
HCT: 46.1 % (ref 39.0–52.0)
Lymphs Abs: 2.2 10*3/uL (ref 0.7–4.0)
MCHC: 33.9 g/dL (ref 30.0–36.0)
MCV: 84.6 fl (ref 78.0–100.0)
Monocytes Absolute: 0.5 10*3/uL (ref 0.1–1.0)
Platelets: 211 10*3/uL (ref 150.0–400.0)
WBC: 7.2 10*3/uL (ref 4.5–10.5)

## 2013-02-26 LAB — BASIC METABOLIC PANEL
BUN: 23 mg/dL (ref 6–23)
CO2: 27 mEq/L (ref 19–32)
Chloride: 103 mEq/L (ref 96–112)
Creatinine, Ser: 1.2 mg/dL (ref 0.4–1.5)
Potassium: 4.1 mEq/L (ref 3.5–5.1)

## 2013-02-26 LAB — LIPID PANEL
Cholesterol: 225 mg/dL — ABNORMAL HIGH (ref 0–200)
HDL: 46.3 mg/dL (ref >39.00)
Triglycerides: 182 mg/dL — ABNORMAL HIGH (ref 0.0–149.0)

## 2013-02-26 LAB — HEPATIC FUNCTION PANEL
ALT: 40 U/L (ref 0–53)
Total Bilirubin: 0.7 mg/dL (ref 0.3–1.2)
Total Protein: 7.9 g/dL (ref 6.0–8.3)

## 2013-02-26 MED ORDER — AMLODIPINE BESYLATE 10 MG PO TABS: 10.0000 mg | ORAL_TABLET | Freq: Every day | ORAL | Status: DC

## 2013-02-26 MED ORDER — PRAVASTATIN SODIUM 80 MG PO TABS: ORAL_TABLET | ORAL | Status: DC

## 2013-02-26 NOTE — Progress Notes (Signed)
Date:  02/26/2013   Name:  Alex Anderson   DOB:  03/17/1971   MRN:  [id number] Gender: male Age: 42 y.o.  Primary Physician:  Hannah Beat, MD   Chief Complaint: Annual Exam   History of Present Illness:  Alex Anderson is a 42 y.o. pleasant patient who presents with the following:  CPX:  Check BP at work  [employer] Readings from Last 3 Encounters:  02/26/13 213 lb 8 oz (96.843 kg)  12/20/11 212 lb 8 oz (96.389 kg)  12/09/10 214 lb 12.8 oz (97.433 kg)   Preventative Health Maintenance Visit:  Health Maintenance Summary Reviewed and updated, unless pt declines services.  Tobacco History Reviewed. Alcohol: No concerns, no excessive use Exercise Habits: not much right now STD concerns: no risk or activity to increase risk Drug Use: None Encouraged self-testicular check  Health Maintenance  Topic Date Due  . Tetanus/tdap  11/11/1991  . Influenza Vaccine  12/01/2012    Labs reviewed with the patient. (2014 labs needed)  Results for orders placed in visit on 12/20/11  BASIC METABOLIC PANEL      Result Value Range   Sodium 138  135 - 145 mEq/L   Potassium 4.0  3.5 - 5.1 mEq/L   Chloride 102  96 - 112 mEq/L   CO2 29  19 - 32 mEq/L   Glucose, Bld 90  70 - 99 mg/dL   BUN 20  6 - 23 mg/dL   Creatinine, Ser 1.1  0.4 - 1.5 mg/dL   Calcium 9.5  8.4 - 78.2 mg/dL   GFR 95.62  >13.08 mL/min  HEPATIC FUNCTION PANEL      Result Value Range   Total Bilirubin 0.7  0.3 - 1.2 mg/dL   Bilirubin, Direct 0.0  0.0 - 0.3 mg/dL   Alkaline Phosphatase 51  39 - 117 U/L   AST 25  0 - 37 U/L   ALT 33  0 - 53 U/L   Total Protein 7.9  6.0 - 8.3 g/dL   Albumin 4.4  3.5 - 5.2 g/dL  LIPID PANEL      Result Value Range   Cholesterol 210 (*) 0 - 200 mg/dL   Triglycerides 657.8  0.0 - 149.0 mg/dL   HDL 46.96  >29.52 mg/dL   VLDL 84.1  0.0 - 32.4 mg/dL   Total CHOL/HDL Ratio 4    LDL CHOLESTEROL, DIRECT      Result Value Range   Direct LDL 130.8       Patient Active Problem List   Diagnosis Date Noted  . Allergic rhinitis due to pollen 11/06/2010  . Screening for diabetes mellitus 11/05/2010  . HYPERLIPIDEMIA 01/22/2008  . HYPERTENSION 01/22/2008    Past Medical History  Diagnosis Date  . Hyperlipidemia   . Hypertension   . Allergic rhinitis due to pollen     allergic rhinitis    No past surgical history on file.  History   Social History  . Marital Status: Married    Spouse Name: N/A    Number of Children: 1  . Years of Education: N/A   Occupational History  . PTA     [employer]   Social History Main Topics  . Smoking status: Never Smoker   . Smokeless tobacco: Never Used  . Alcohol Use: No  . Drug Use: No  . Sexual Activity: Yes   Other Topics Concern  . Not on file   Social History Narrative  . No narrative on file  Family History  Problem Relation Age of Onset  . Adopted: Yes    Allergies  Allergen Reactions  . Ace Inhibitors     REACTION: cough  . Hydrocodone     Medication list has been reviewed and updated.  Outpatient Prescriptions Prior to Visit  Medication Sig Dispense Refill  . amLODipine (NORVASC) 10 MG tablet TAKE ONE TABLET BY MOUTH ONCE DAILY  **NEED TO BE SEEN FOR FURTHER REFILLS**  30 tablet  1  . pravastatin (PRAVACHOL) 80 MG tablet TAKE ONE TABLET BY MOUTH EVERY DAY  30 tablet  1  . fluticasone (FLONASE) 50 MCG/ACT nasal spray Place 2 sprays into the nose as needed.         No facility-administered medications prior to visit.    Review of Systems:   General: Denies fever, chills, sweats. No significant weight loss. Eyes: Denies blurring,significant itching ENT: Denies earache, sore throat, and hoarseness. Cardiovascular: Denies chest pains, palpitations, dyspnea on exertion Respiratory: Denies cough, dyspnea at rest,wheeezing Breast: no concerns about lumps GI: Denies nausea, vomiting, diarrhea, constipation, change in bowel habits, abdominal pain, melena, hematochezia GU: Denies penile  discharge, ED, urinary flow / outflow problems. No STD concerns. Musculoskeletal: Denies back pain, joint pain Derm: Denies rash, itching Neuro: Denies  paresthesias, frequent falls, frequent headaches Psych: Denies depression, anxiety Endocrine: Denies cold intolerance, heat intolerance, polydipsia Heme: Denies enlarged lymph nodes Allergy: No hayfever  Physical Examination: BP 124/96  Pulse 79  Temp(Src) 98.6 F (37 C) (Oral)  Ht 5' 7.75" (1.721 m)  Wt 213 lb 8 oz (96.843 kg)  BMI 32.7 kg/m2  Ideal Body Weight: Weight in (lb) to have BMI = 25: 162.9  GEN: well developed, well nourished, no acute distress Eyes: conjunctiva and lids normal, PERRLA, EOMI ENT: TM clear, nares clear, oral exam WNL Neck: supple, no lymphadenopathy, no thyromegaly, no JVD Pulm: clear to auscultation and percussion, respiratory effort normal CV: regular rate and rhythm, S1-S2, no murmur, rub or gallop, no bruits, peripheral pulses normal and symmetric, no cyanosis, clubbing, edema or varicosities GI: soft, non-tender; no hepatosplenomegaly, masses; active bowel sounds all quadrants GU: no hernia, testicular mass, penile discharge Lymph: no cervical, axillary or inguinal adenopathy MSK: gait normal, muscle tone and strength WNL, no joint swelling, effusions, discoloration, crepitus  SKIN: clear, good turgor, color WNL, no rashes, lesions, or ulcerations Neuro: normal mental status, normal strength, sensation, and motion Psych: alert; oriented to person, place and time, normally interactive and not anxious or depressed in appearance.  Assessment and Plan: Routine general medical examination at a health care facility  Other and unspecified hyperlipidemia - Plan: Lipid panel  Encounter for long-term (current) use of other medications - Plan: Basic metabolic panel, CBC with Differential, Hepatic function panel   The patient's preventative maintenance and recommended screening tests for an annual  wellness exam were reviewed in full today. Brought up to date unless services declined.  Counselled on the importance of diet, exercise, and its role in overall health and mortality. The patient's FH and SH was reviewed, including their home life, tobacco status, and drug and alcohol status.   Refill meds Check BP after moving is over. Will call / email if high  Signed,  Karleen Hampshire T. Wilman Tucker, MD, CAQ Sports Medicine  [facility] [address] [place] [address] Phone: [phone] Fax: [phone]

## 2013-02-27 ENCOUNTER — Encounter: Payer: Self-pay | Admitting: *Deleted

## 2013-03-15 ENCOUNTER — Telehealth: Payer: Self-pay

## 2013-03-15 NOTE — Telephone Encounter (Signed)
Spoke wit [employer].  We had a letter returned and I was just trying to verify that we had the correct address on file.  Address was verified with what we have in his chart.  Alex Anderson states he was able to view his labs on My Chart.  Will call his insurance to check the cost of Lipitor and call us back.

## 2013-03-15 NOTE — Telephone Encounter (Signed)
Pt left v/m returning Donna's call.pt request cb [phone].

## 2013-03-20 ENCOUNTER — Telehealth: Payer: Self-pay

## 2013-03-20 NOTE — Telephone Encounter (Signed)
Pt said his insurance co did have Atorvastatin on the formulary but pt thought there was a question of quantity allowed. Pt will speak with someone at ins co. Rather than going on internet and call back with more info before putting request to Dr Copland to change med to Atorvastatin. [facility] [address]

## 2013-04-18 ENCOUNTER — Telehealth: Payer: Self-pay

## 2013-04-18 NOTE — Telephone Encounter (Signed)
Switch to   Atorvastatin 80 mg, 1 po qhs, #30, 11 refills  Pharm of choice  thanks

## 2013-04-18 NOTE — Telephone Encounter (Signed)
Pt checked with ins co and atorvastatin is approved for any mg but insurance only allows a 30 day supply each month (cannot get 90 tabs). Pt request atorvastatin sent to [facility] [address]

## 2013-04-19 MED ORDER — ATORVASTATIN CALCIUM 80 MG PO TABS: 80.0000 mg | ORAL_TABLET | Freq: Every day | ORAL | Status: DC

## 2013-10-08 ENCOUNTER — Other Ambulatory Visit: Payer: Self-pay | Admitting: *Deleted

## 2013-10-08 MED ORDER — ATORVASTATIN CALCIUM 80 MG PO TABS: 80.0000 mg | ORAL_TABLET | Freq: Every day | ORAL | Status: DC

## 2013-10-08 MED ORDER — AMLODIPINE BESYLATE 10 MG PO TABS: 10.0000 mg | ORAL_TABLET | Freq: Every day | ORAL | Status: DC

## 2013-11-28 ENCOUNTER — Encounter: Payer: Self-pay | Admitting: Family Medicine

## 2014-01-08 ENCOUNTER — Other Ambulatory Visit: Payer: Self-pay | Admitting: Family Medicine

## 2014-01-09 MED ORDER — FLUTICASONE PROPIONATE 50 MCG/ACT NA SUSP: 2.0000 | NASAL | Status: AC | PRN

## 2014-01-09 MED ORDER — ATORVASTATIN CALCIUM 80 MG PO TABS: 80.0000 mg | ORAL_TABLET | Freq: Every day | ORAL | Status: DC

## 2014-01-09 MED ORDER — AMLODIPINE BESYLATE 10 MG PO TABS: 10.0000 mg | ORAL_TABLET | Freq: Every day | ORAL | Status: DC

## 2014-02-28 ENCOUNTER — Telehealth: Payer: Self-pay | Admitting: Family Medicine

## 2014-02-28 ENCOUNTER — Other Ambulatory Visit: Payer: Self-pay | Admitting: Family Medicine

## 2014-02-28 DIAGNOSIS — E785 Hyperlipidemia, unspecified: Secondary | ICD-10-CM

## 2014-02-28 DIAGNOSIS — Z79899 Other long term (current) drug therapy: Secondary | ICD-10-CM

## 2014-02-28 NOTE — Telephone Encounter (Signed)
It is fine for him to get his labs, but non-urgently. I would probably schedule it around a time when he can get a CPX. (Week before)  Fine to set up for PPD if he needs more acutely.   FLP, E78.5 Cbc with diff, HFP, BMET: Z79.899

## 2014-02-28 NOTE — Telephone Encounter (Signed)
Shirlean Mylar, Will you please call and schedule patient CPE with fasting labs prior.  If he needs a PPD done before we can get him in for his physical, then schedule a nurse visit just for that.  Thanks, Butch Penny

## 2014-02-28 NOTE — Telephone Encounter (Signed)
Pt walked in this morning to try to get labs done. He said that he thought he needed his lipids checked again but wasn't sure what other lab work he may need. Requesting to try to get this done today. He would also like the PPD injection as well.

## 2014-02-28 NOTE — Telephone Encounter (Signed)
Spoke with Alex Anderson.  No labs were drawn today.  Alex Anderson stated he will wait to hear back from Dr. Lorelei Pont.  Looks like he needs to schedule a CPE.  Last office visit 02/26/2013.  Please advice.

## 2014-02-28 NOTE — Telephone Encounter (Signed)
Lab orders are in as future orders.

## 2014-03-01 ENCOUNTER — Other Ambulatory Visit (INDEPENDENT_AMBULATORY_CARE_PROVIDER_SITE_OTHER): Payer: 59

## 2014-03-01 ENCOUNTER — Ambulatory Visit (INDEPENDENT_AMBULATORY_CARE_PROVIDER_SITE_OTHER): Payer: 59 | Admitting: *Deleted

## 2014-03-01 DIAGNOSIS — I10 Essential (primary) hypertension: Secondary | ICD-10-CM

## 2014-03-01 DIAGNOSIS — Z79899 Other long term (current) drug therapy: Secondary | ICD-10-CM

## 2014-03-01 DIAGNOSIS — Z111 Encounter for screening for respiratory tuberculosis: Secondary | ICD-10-CM

## 2014-03-01 DIAGNOSIS — Z131 Encounter for screening for diabetes mellitus: Secondary | ICD-10-CM

## 2014-03-01 DIAGNOSIS — E785 Hyperlipidemia, unspecified: Secondary | ICD-10-CM

## 2014-03-01 LAB — BASIC METABOLIC PANEL
BUN: 17 mg/dL (ref 6–23)
CALCIUM: 9.3 mg/dL (ref 8.4–10.5)
CO2: 27 mEq/L (ref 19–32)
Chloride: 104 mEq/L (ref 96–112)
Creatinine, Ser: 1.2 mg/dL (ref 0.4–1.5)
GFR: 72.2 mL/min (ref >60.00)
Glucose, Bld: 99 mg/dL (ref 70–99)
Potassium: 4.1 mEq/L (ref 3.5–5.1)
Sodium: 137 mEq/L (ref 135–145)

## 2014-03-01 LAB — LIPID PANEL
CHOLESTEROL: 204 mg/dL — AB (ref 0–200)
HDL: 49.1 mg/dL (ref >39.00)
LDL Cholesterol: 129 mg/dL — ABNORMAL HIGH (ref 0–99)
NONHDL: 154.9
Total CHOL/HDL Ratio: 4
Triglycerides: 130 mg/dL (ref 0.0–149.0)
VLDL: 26 mg/dL (ref 0.0–40.0)

## 2014-03-01 LAB — HEPATIC FUNCTION PANEL
ALT: 48 U/L (ref 0–53)
AST: 36 U/L (ref 0–37)
Albumin: 3.6 g/dL (ref 3.5–5.2)
Alkaline Phosphatase: 56 U/L (ref 39–117)
BILIRUBIN DIRECT: 0 mg/dL (ref 0.0–0.3)
Total Bilirubin: 0.9 mg/dL (ref 0.2–1.2)
Total Protein: 7.7 g/dL (ref 6.0–8.3)

## 2014-03-04 LAB — TB SKIN TEST
Induration: 0 mm
TB Skin Test: NEGATIVE

## 2014-03-05 NOTE — Telephone Encounter (Signed)
Labs done 10/30  cpx 11/9  Pt aware

## 2014-03-11 ENCOUNTER — Ambulatory Visit (INDEPENDENT_AMBULATORY_CARE_PROVIDER_SITE_OTHER): Payer: 59 | Admitting: Family Medicine

## 2014-03-11 ENCOUNTER — Encounter: Payer: Self-pay | Admitting: Family Medicine

## 2014-03-11 VITALS — BP 116/90 | HR 78 | Temp 98.4°F | Ht 67.5 in | Wt 218.8 lb

## 2014-03-11 DIAGNOSIS — Z0000 Encounter for general adult medical examination without abnormal findings: Secondary | ICD-10-CM

## 2014-03-11 DIAGNOSIS — Z23 Encounter for immunization: Secondary | ICD-10-CM

## 2014-03-11 NOTE — Progress Notes (Signed)
Pre visit review using our clinic review tool, if applicable. No additional management support is needed unless otherwise documented below in the visit note. 

## 2014-03-11 NOTE — Progress Notes (Signed)
Dr. Frederico Hamman T. Ilham Roughton, MD, [facility] and [facility] Medicine [place], [address] Phone: [phone] Fax: [phone]  03/11/2014  Patient: Alex Anderson, MRN: [id number], DOB: [birth year], 43 y.o.  Primary Physician:  Owens Loffler, MD  Chief Complaint: Annual Exam  Subjective:   Alex Anderson is a 43 y.o. pleasant patient who presents with the following:  Preventative Health Maintenance Visit:  Health Maintenance Summary Reviewed and updated, unless pt declines services.  Tobacco History Reviewed. Alcohol: No concerns, no excessive use Exercise Habits: Some activity, rec at least 30 mins 5 times a week STD concerns: no risk or activity to increase risk Drug Use: None Encouraged self-testicular check  Health Maintenance  Topic Date Due  . TETANUS/TDAP  11/11/1991  . INFLUENZA VACCINE  12/01/2013   Immunization History  Administered Date(s) Administered  . Influenza,inj,Quad PF,36+ Mos 03/11/2014  . PPD Test 03/01/2014   Patient Active Problem List   Diagnosis Date Noted  . Allergic rhinitis due to pollen 11/06/2010  . Screening for diabetes mellitus 11/05/2010  . HYPERLIPIDEMIA 01/22/2008  . HYPERTENSION 01/22/2008   Past Medical History  Diagnosis Date  . Hyperlipidemia   . Hypertension   . Allergic rhinitis due to pollen     allergic rhinitis   No past surgical history on file. History   Social History  . Marital Status: Married    Spouse Name: N/A    Number of Children: 1  . Years of Education: N/A   Occupational History  . PTA     [employer]   Social History Main Topics  . Smoking status: Never Smoker   . Smokeless tobacco: Never Used  . Alcohol Use: No  . Drug Use: No  . Sexual Activity: Yes   Other Topics Concern  . Not on file   Social History Narrative   Family History  Problem Relation Age of Onset  . Adopted: Yes   Allergies  Allergen Reactions  . Ace Inhibitors     REACTION: cough    . Hydrocodone     Medication list has been reviewed and updated.   General: Denies fever, chills, sweats. No significant weight loss. Eyes: Denies blurring,significant itching ENT: Denies earache, sore throat, and hoarseness. Cardiovascular: Denies chest pains, palpitations, dyspnea on exertion Respiratory: Denies cough, dyspnea at rest,wheeezing Breast: no concerns about lumps GI: Denies nausea, vomiting, diarrhea, constipation, change in bowel habits, abdominal pain, melena, hematochezia GU: Denies penile discharge, ED, urinary flow / outflow problems. No STD concerns. Musculoskeletal: Denies back pain, joint pain Derm: Denies rash, itching Neuro: Denies  paresthesias, frequent falls, frequent headaches Psych: Denies depression, anxiety Endocrine: Denies cold intolerance, heat intolerance, polydipsia Heme: Denies enlarged lymph nodes Allergy: No hayfever  Objective:   BP 116/90 mmHg  Pulse 78  Temp(Src) 98.4 F (36.9 C) (Oral)  Ht 5' 7.5" (1.715 m)  Wt 218 lb 12 oz (99.224 kg)  BMI 33.74 kg/m2 Ideal Body Weight: Weight in (lb) to have BMI = 25: 161.7  No exam data present  GEN: well developed, well nourished, no acute distress Eyes: conjunctiva and lids normal, PERRLA, EOMI ENT: TM clear, nares clear, oral exam WNL Neck: supple, no lymphadenopathy, no thyromegaly, no JVD Pulm: clear to auscultation and percussion, respiratory effort normal CV: regular rate and rhythm, S1-S2, no murmur, rub or gallop, no bruits, peripheral pulses normal and symmetric, no cyanosis, clubbing, edema or varicosities GI: soft, non-tender; no hepatosplenomegaly, masses; active bowel sounds all quadrants GU:  defer Lymph: no cervical, axillary or inguinal adenopathy MSK: gait normal, muscle tone and strength WNL, no joint swelling, effusions, discoloration, crepitus  SKIN: clear, good turgor, color WNL, no rashes, lesions, or ulcerations Neuro: normal mental status, normal strength,  sensation, and motion Psych: alert; oriented to person, place and time, normally interactive and not anxious or depressed in appearance. All labs reviewed with patient.  Lipids:    Component Value Date/Time   CHOL 204* 03/01/2014 0844   TRIG 130.0 03/01/2014 0844   HDL 49.10 03/01/2014 0844   LDLDIRECT 146.0 02/26/2013 0923   VLDL 26.0 03/01/2014 0844   CHOLHDL 4 03/01/2014 0844   CBC: CBC Latest Ref Rng 02/26/2013 09/30/2009  WBC 4.5 - 10.5 K/uL 7.2 6.6  Hemoglobin 13.0 - 17.0 g/dL 15.6 14.6  Hematocrit 39.0 - 52.0 % 46.1 42.9  Platelets 150.0 - 400.0 K/uL 211.0 976.7    Basic Metabolic Panel:    Component Value Date/Time   NA 137 03/01/2014 0844   K 4.1 03/01/2014 0844   CL 104 03/01/2014 0844   CO2 27 03/01/2014 0844   BUN 17 03/01/2014 0844   CREATININE 1.2 03/01/2014 0844   GLUCOSE 99 03/01/2014 0844   CALCIUM 9.3 03/01/2014 0844   Hepatic Function Latest Ref Rng 03/01/2014 02/26/2013 12/20/2011  Total Protein 6.0 - 8.3 g/dL 7.7 7.9 7.9  Albumin 3.5 - 5.2 g/dL 3.6 4.2 4.4  AST 0 - 37 U/L 36 27 25  ALT 0 - 53 U/L 48 40 33  Alk Phosphatase 39 - 117 U/L 56 52 51  Total Bilirubin 0.2 - 1.2 mg/dL 0.9 0.7 0.7  Bilirubin, Direct 0.0 - 0.3 mg/dL 0.0 0.0 0.0    Lab Results  Component Value Date   TSH 1.45 09/30/2009   No results found for: PSA  Assessment and Plan:   Healthcare maintenance  Need for prophylactic vaccination and inoculation against influenza - Plan: Flu Vaccine QUAD 36+ mos IM  Health Maintenance Exam: The patient's preventative maintenance and recommended screening tests for an annual wellness exam were reviewed in full today. Brought up to date unless services declined.  Counselled on the importance of diet, exercise, and its role in overall health and mortality. The patient's FH and SH was reviewed, including their home life, tobacco status, and drug and alcohol status.  Follow-up: No Follow-up on file. Unless noted, follow-up in 1 year for  Health Maintenance Exam.  New Prescriptions   No medications on file   Orders Placed This Encounter  Procedures  . Flu Vaccine QUAD 36+ mos IM    Signed,  Gared Gillie T. Aidenjames Heckmann, MD   Patient's Medications  New Prescriptions   No medications on file  Previous Medications   AMLODIPINE (NORVASC) 10 MG TABLET    Take 1 tablet (10 mg total) by mouth daily.   ATORVASTATIN (LIPITOR) 80 MG TABLET    Take 40 mg by mouth daily.   FLUTICASONE (FLONASE) 50 MCG/ACT NASAL SPRAY    Place 2 sprays into both nostrils as needed.  Modified Medications   No medications on file  Discontinued Medications   ATORVASTATIN (LIPITOR) 80 MG TABLET    Take 1 tablet (80 mg total) by mouth at bedtime.

## 2014-04-08 ENCOUNTER — Other Ambulatory Visit: Payer: Self-pay | Admitting: Family Medicine

## 2014-06-24 ENCOUNTER — Other Ambulatory Visit: Payer: 59

## 2014-07-01 ENCOUNTER — Encounter: Payer: 59 | Admitting: Family Medicine

## 2015-01-14 ENCOUNTER — Encounter: Payer: Self-pay | Admitting: Family Medicine

## 2015-06-07 ENCOUNTER — Telehealth: Payer: Self-pay | Admitting: Family Medicine

## 2015-06-07 NOTE — Telephone Encounter (Signed)
Last office visit 03/11/2014.  Refill?

## 2015-06-08 NOTE — Telephone Encounter (Signed)
Refill #90 each, 0 ref  F/u CPX with me

## 2015-06-08 NOTE — Telephone Encounter (Signed)
Please call and schedule CPE with fasting labs prior with Dr. Copland.  

## 2015-06-11 NOTE — Telephone Encounter (Signed)
Labs 3/13 cpx 3/16 Pt aware Please close

## 2015-07-14 ENCOUNTER — Other Ambulatory Visit (INDEPENDENT_AMBULATORY_CARE_PROVIDER_SITE_OTHER): Payer: 59

## 2015-07-14 DIAGNOSIS — E785 Hyperlipidemia, unspecified: Secondary | ICD-10-CM | POA: Diagnosis not present

## 2015-07-14 DIAGNOSIS — Z79899 Other long term (current) drug therapy: Secondary | ICD-10-CM

## 2015-07-14 LAB — CBC WITH DIFFERENTIAL/PLATELET
BASOS PCT: 0.6 % (ref 0.0–3.0)
Basophils Absolute: 0 10*3/uL (ref 0.0–0.1)
EOS PCT: 2.3 % (ref 0.0–5.0)
Eosinophils Absolute: 0.1 10*3/uL (ref 0.0–0.7)
HCT: 45.2 % (ref 39.0–52.0)
HEMOGLOBIN: 15 g/dL (ref 13.0–17.0)
LYMPHS ABS: 1.6 10*3/uL (ref 0.7–4.0)
Lymphocytes Relative: 25.2 % (ref 12.0–46.0)
MCHC: 33.2 g/dL (ref 30.0–36.0)
MCV: 85.5 fl (ref 78.0–100.0)
Monocytes Absolute: 0.6 10*3/uL (ref 0.1–1.0)
Monocytes Relative: 9.1 % (ref 3.0–12.0)
Neutro Abs: 4.1 10*3/uL (ref 1.4–7.7)
Neutrophils Relative %: 62.8 % (ref 43.0–77.0)
Platelets: 184 10*3/uL (ref 150.0–400.0)
RBC: 5.29 Mil/uL (ref 4.22–5.81)
RDW: 14.2 % (ref 11.5–15.5)
WBC: 6.5 10*3/uL (ref 4.0–10.5)

## 2015-07-14 LAB — BASIC METABOLIC PANEL
BUN: 19 mg/dL (ref 6–23)
CHLORIDE: 101 meq/L (ref 96–112)
CO2: 30 mEq/L (ref 19–32)
Calcium: 9.3 mg/dL (ref 8.4–10.5)
Creatinine, Ser: 1.05 mg/dL (ref 0.40–1.50)
GFR: 82.06 mL/min (ref >60.00)
GLUCOSE: 102 mg/dL — AB (ref 70–99)
POTASSIUM: 4 meq/L (ref 3.5–5.1)
Sodium: 137 mEq/L (ref 135–145)

## 2015-07-14 LAB — HEPATIC FUNCTION PANEL
ALT: 39 U/L (ref 0–53)
AST: 26 U/L (ref 0–37)
Albumin: 4.2 g/dL (ref 3.5–5.2)
Alkaline Phosphatase: 53 U/L (ref 39–117)
BILIRUBIN TOTAL: 0.8 mg/dL (ref 0.2–1.2)
Bilirubin, Direct: 0.1 mg/dL (ref 0.0–0.3)
Total Protein: 7.2 g/dL (ref 6.0–8.3)

## 2015-07-14 LAB — LIPID PANEL
Cholesterol: 192 mg/dL (ref 0–200)
HDL: 50.6 mg/dL (ref >39.00)
LDL CALC: 114 mg/dL — AB (ref 0–99)
NONHDL: 141.89
Total CHOL/HDL Ratio: 4
Triglycerides: 139 mg/dL (ref 0.0–149.0)
VLDL: 27.8 mg/dL (ref 0.0–40.0)

## 2015-07-17 ENCOUNTER — Ambulatory Visit (INDEPENDENT_AMBULATORY_CARE_PROVIDER_SITE_OTHER): Payer: 59 | Admitting: Family Medicine

## 2015-07-17 ENCOUNTER — Encounter: Payer: Self-pay | Admitting: Family Medicine

## 2015-07-17 VITALS — BP 120/90 | HR 85 | Temp 97.8°F | Ht 67.5 in | Wt 214.5 lb

## 2015-07-17 DIAGNOSIS — Z0000 Encounter for general adult medical examination without abnormal findings: Secondary | ICD-10-CM | POA: Diagnosis not present

## 2015-07-17 DIAGNOSIS — Z23 Encounter for immunization: Secondary | ICD-10-CM

## 2015-07-17 MED ORDER — AMLODIPINE BESYLATE 10 MG PO TABS: ORAL_TABLET | ORAL | Status: DC

## 2015-07-17 NOTE — Progress Notes (Signed)
Pre visit review using our clinic review tool, if applicable. No additional management support is needed unless otherwise documented below in the visit note. 

## 2015-07-17 NOTE — Progress Notes (Signed)
Dr. Frederico Hamman T. De Libman, MD, [facility] Primary [facility] and Sports Medicine [place], [address] Phone: [phone] Fax: [phone]  07/17/2015  Patient: Alex Anderson, MRN: [id number], DOB: [birth year], 45 y.o.  Primary Physician:  Owens Loffler, MD   Chief Complaint  Patient presents with  . Annual Exam   Subjective:   Alex Anderson is a 45 y.o. pleasant patient who presents with the following:  Preventative Health Maintenance Visit:  Health Maintenance Summary Reviewed and updated, unless pt declines services.  Tobacco History Reviewed. Alcohol: No concerns, no excessive use Exercise Habits: Some activity, rec at least 30 mins 5 times a week - on the treadmill a lot STD concerns: no risk or activity to increase risk Drug Use: None Encouraged self-testicular check  Doing well, lost about 10-15 pounds  Health Maintenance  Topic Date Due  . HIV Screening  11/11/1987  . INFLUENZA VACCINE  08/01/2015 (Originally 12/02/2014)  . TETANUS/TDAP  07/16/2025   Immunization History  Administered Date(s) Administered  . Influenza,inj,Quad PF,36+ Mos 03/11/2014  . PPD Test 03/01/2014  . Tdap 07/17/2015   Patient Active Problem List   Diagnosis Date Noted  . Allergic rhinitis due to pollen 11/06/2010  . Screening for diabetes mellitus 11/05/2010  . HYPERLIPIDEMIA 01/22/2008  . HYPERTENSION 01/22/2008   Past Medical History  Diagnosis Date  . Hyperlipidemia   . Hypertension   . Allergic rhinitis due to pollen     allergic rhinitis   No past surgical history on file. Social History   Social History  . Marital Status: Married    Spouse Name: N/A  . Number of Children: 1  . Years of Education: N/A   Occupational History  . PTA     [employer]   Social History Main Topics  . Smoking status: Never Smoker   . Smokeless tobacco: Never Used  . Alcohol Use: No  . Drug Use: No  . Sexual Activity: Yes   Other Topics Concern  . Not on file    Social History Narrative   Family History  Problem Relation Age of Onset  . Adopted: Yes   Allergies  Allergen Reactions  . Ace Inhibitors     REACTION: cough  . Hydrocodone     Medication list has been reviewed and updated.   General: Denies fever, chills, sweats. No significant weight loss. Eyes: Denies blurring,significant itching ENT: Denies earache, sore throat, and hoarseness. Cardiovascular: Denies chest pains, palpitations, dyspnea on exertion Respiratory: Denies cough, dyspnea at rest,wheeezing Breast: no concerns about lumps GI: Denies nausea, vomiting, diarrhea, constipation, change in bowel habits, abdominal pain, melena, hematochezia GU: Denies penile discharge, ED, urinary flow / outflow problems. No STD concerns. Musculoskeletal: Denies back pain, joint pain Derm: Denies rash, itching Neuro: Denies  paresthesias, frequent falls, frequent headaches Psych: Denies depression, anxiety Endocrine: Denies cold intolerance, heat intolerance, polydipsia Heme: Denies enlarged lymph nodes Allergy: No hayfever  Objective:   BP 120/90 mmHg  Pulse 85  Temp(Src) 97.8 F (36.6 C) (Oral)  Ht 5' 7.5" (1.715 m)  Wt 214 lb 8 oz (97.297 kg)  BMI 33.08 kg/m2 Ideal Body Weight: Weight in (lb) to have BMI = 25: 161.7  No exam data present  GEN: well developed, well nourished, no acute distress Eyes: conjunctiva and lids normal, PERRLA, EOMI ENT: TM clear, nares clear, oral exam WNL Neck: supple, no lymphadenopathy, no thyromegaly, no JVD Pulm: clear to auscultation and percussion, respiratory effort normal CV: regular rate and  rhythm, S1-S2, no murmur, rub or gallop, no bruits, peripheral pulses normal and symmetric, no cyanosis, clubbing, edema or varicosities GI: soft, non-tender; no hepatosplenomegaly, masses; active bowel sounds all quadrants GU: no hernia, testicular mass, penile discharge Lymph: no cervical, axillary or inguinal adenopathy MSK: gait normal,  muscle tone and strength WNL, no joint swelling, effusions, discoloration, crepitus  SKIN: clear, good turgor, color WNL, no rashes, lesions, or ulcerations Neuro: normal mental status, normal strength, sensation, and motion Psych: alert; oriented to person, place and time, normally interactive and not anxious or depressed in appearance. All labs reviewed with patient.  Lipids:    Component Value Date/Time   CHOL 192 07/14/2015 1031   TRIG 139.0 07/14/2015 1031   HDL 50.60 07/14/2015 1031   LDLDIRECT 146.0 02/26/2013 0923   VLDL 27.8 07/14/2015 1031   CHOLHDL 4 07/14/2015 1031   CBC: CBC Latest Ref Rng 07/14/2015 02/26/2013 09/30/2009  WBC 4.0 - 10.5 K/uL 6.5 7.2 6.6  Hemoglobin 13.0 - 17.0 g/dL 15.0 15.6 14.6  Hematocrit 39.0 - 52.0 % 45.2 46.1 42.9  Platelets 150.0 - 400.0 K/uL 184.0 211.0 818.2    Basic Metabolic Panel:    Component Value Date/Time   NA 137 07/14/2015 1031   K 4.0 07/14/2015 1031   CL 101 07/14/2015 1031   CO2 30 07/14/2015 1031   BUN 19 07/14/2015 1031   CREATININE 1.05 07/14/2015 1031   GLUCOSE 102* 07/14/2015 1031   CALCIUM 9.3 07/14/2015 1031   Hepatic Function Latest Ref Rng 07/14/2015 03/01/2014 02/26/2013  Total Protein 6.0 - 8.3 g/dL 7.2 7.7 7.9  Albumin 3.5 - 5.2 g/dL 4.2 3.6 4.2  AST 0 - 37 U/L 26 36 27  ALT 0 - 53 U/L 39 48 40  Alk Phosphatase 39 - 117 U/L 53 56 52  Total Bilirubin 0.2 - 1.2 mg/dL 0.8 0.9 0.7  Bilirubin, Direct 0.0 - 0.3 mg/dL 0.1 0.0 0.0    Lab Results  Component Value Date   TSH 1.45 09/30/2009   No results found for: PSA  Assessment and Plan:   Healthcare maintenance  Need for prophylactic vaccination with combined diphtheria-tetanus-pertussis (DTP) vaccine - Plan: Tdap vaccine greater than or equal to 7yo IM  Health Maintenance Exam: The patient's preventative maintenance and recommended screening tests for an annual wellness exam were reviewed in full today. Brought up to date unless services  declined.  Counselled on the importance of diet, exercise, and its role in overall health and mortality. The patient's FH and SH was reviewed, including their home life, tobacco status, and drug and alcohol status.  Follow-up: No Follow-up on file. Unless noted, follow-up in 1 year for Health Maintenance Exam.  Modified Medications   Modified Medication Previous Medication   AMLODIPINE (NORVASC) 10 MG TABLET amLODipine (NORVASC) 10 MG tablet      Take 1 tablet by mouth  daily    Take 1 tablet by mouth  daily   Orders Placed This Encounter  Procedures  . Tdap vaccine greater than or equal to 7yo IM    Signed,  Trystian Crisanto T. Deeandra Jerry, MD   Patient's Medications  New Prescriptions   No medications on file  Previous Medications   ATORVASTATIN (LIPITOR) 80 MG TABLET    Take 1 tablet by mouth at  bedtime   FLUTICASONE (FLONASE) 50 MCG/ACT NASAL SPRAY    Place 2 sprays into both nostrils as needed.  Modified Medications   Modified Medication Previous Medication   AMLODIPINE (NORVASC) 10 MG TABLET  amLODipine (NORVASC) 10 MG tablet      Take 1 tablet by mouth  daily    Take 1 tablet by mouth  daily  Discontinued Medications   No medications on file

## 2015-09-05 ENCOUNTER — Other Ambulatory Visit: Payer: Self-pay | Admitting: Family Medicine

## 2015-10-02 ENCOUNTER — Encounter: Payer: Self-pay | Admitting: Primary Care

## 2015-10-02 ENCOUNTER — Encounter: Payer: Self-pay | Admitting: Family Medicine

## 2015-10-02 ENCOUNTER — Ambulatory Visit (INDEPENDENT_AMBULATORY_CARE_PROVIDER_SITE_OTHER): Payer: 59 | Admitting: Primary Care

## 2015-10-02 VITALS — BP 130/90 | HR 75 | Temp 98.3°F | Ht 67.5 in | Wt 218.5 lb

## 2015-10-02 DIAGNOSIS — J069 Acute upper respiratory infection, unspecified: Secondary | ICD-10-CM

## 2015-10-02 NOTE — Progress Notes (Signed)
Pre visit review using our clinic review tool, if applicable. No additional management support is needed unless otherwise documented below in the visit note. 

## 2015-10-02 NOTE — Progress Notes (Signed)
Subjective:    Patient ID: Alex Anderson, male    DOB: 1971-04-21, 45 y.o.   MRN: [id number]  HPI  Mr. Holthaus is a 45 year old male who presents today with a chief complaint of cough. He also reports rhinorrhea, PND, burning to his throat. He's been taking Advil, using Flonase, gargling salt water without much improvement. His symptoms began Monday night this week. Denies fevers, chills, fatigue, rash. His child's daycare has an outbreak of hand, foot, mouth disease. His child has no symptoms. His cough is productive occasionally with yellow sputum. Overall his symptoms are about the same.   Review of Systems  Constitutional: Negative for fever and fatigue.  HENT: Positive for congestion, rhinorrhea, sinus pressure, sneezing and sore throat.   Eyes: Positive for itching.  Respiratory: Positive for cough. Negative for shortness of breath and wheezing.        Past Medical History  Diagnosis Date  . Hyperlipidemia   . Hypertension   . Allergic rhinitis due to pollen     allergic rhinitis     Social History   Social History  . Marital Status: Married    Spouse Name: N/A  . Number of Children: 1  . Years of Education: N/A   Occupational History  . PTA     [employer]   Social History Main Topics  . Smoking status: Never Smoker   . Smokeless tobacco: Never Used  . Alcohol Use: No  . Drug Use: No  . Sexual Activity: Yes   Other Topics Concern  . Not on file   Social History Narrative    No past surgical history on file.  Family History  Problem Relation Age of Onset  . Adopted: Yes    Allergies  Allergen Reactions  . Ace Inhibitors     REACTION: cough  . Hydrocodone     Current Outpatient Prescriptions on File Prior to Visit  Medication Sig Dispense Refill  . amLODipine (NORVASC) 10 MG tablet Take 1 tablet by mouth  daily 90 tablet 3  . atorvastatin (LIPITOR) 80 MG tablet Take 1 tablet by mouth at  bedtime 90 tablet 1  . fluticasone (FLONASE) 50 MCG/ACT  nasal spray Place 2 sprays into both nostrils as needed. 16 g 5   No current facility-administered medications on file prior to visit.    BP 130/90 mmHg  Pulse 75  Temp(Src) 98.3 F (36.8 C) (Oral)  Ht 5' 7.5" (1.715 m)  Wt 218 lb 8 oz (99.111 kg)  BMI 33.70 kg/m2  SpO2 96%    Objective:   Physical Exam  Constitutional: He appears well-nourished.  HENT:  Right Ear: Tympanic membrane and ear canal normal.  Left Ear: Tympanic membrane and ear canal normal.  Nose: No mucosal edema. Right sinus exhibits no maxillary sinus tenderness and no frontal sinus tenderness. Left sinus exhibits no maxillary sinus tenderness and no frontal sinus tenderness.  Mouth/Throat: Oropharynx is clear and moist.  Eyes: Conjunctivae are normal.  Neck: Neck supple.  Cardiovascular: Normal rate and regular rhythm.   Pulmonary/Chest: Effort normal and breath sounds normal. He has no wheezes. He has no rales.  Skin: Skin is warm and dry.          Assessment & Plan:  URI:  Cough, PND, sore throat, x 3 days. Some improvement with OTC treatment. Exam today with clear lungs, mild nasal mucosal edema, otherwise unremarkable. Suspect viral/allergy involvement and will treat with supportive measures. Continue Flonse and Advil. Start Zyrtec  HS. Fluids, rest, return precautions provided.

## 2015-10-02 NOTE — Patient Instructions (Signed)
Your symptoms are related to a viral infection that will pass on it's own.   Nasal Congestion/Runny Nose: Continue using Flonase (fluticasone) nasal spray. Instill 2 sprays in each nostril once daily.   Continue with Advil as needed for sore throat.  Start taking Zyrtec every evening to help dry up drainage at night.  Cough: Robitussin or Delsym. These may be purchased over the counter.  Please notify me if you develop persistent fevers of 101, start coughing up green mucous consistently, notice increased fatigue or weakness, or feel worse after 1 week of onset of symptoms.   Increase consumption of water intake and rest.  It was a pleasure meeting you!  Upper Respiratory Infection, Adult Most upper respiratory infections (URIs) are a viral infection of the air passages leading to the lungs. A URI affects the nose, throat, and upper air passages. The most common type of URI is nasopharyngitis and is typically referred to as "the common cold." URIs run their course and usually go away on their own. Most of the time, a URI does not require medical attention, but sometimes a bacterial infection in the upper airways can follow a viral infection. This is called a secondary infection. Sinus and middle ear infections are common types of secondary upper respiratory infections. Bacterial pneumonia can also complicate a URI. A URI can worsen asthma and chronic obstructive pulmonary disease (COPD). Sometimes, these complications can require emergency medical care and may be life threatening.  CAUSES Almost all URIs are caused by viruses. A virus is a type of germ and can spread from one person to another.  RISKS FACTORS You may be at risk for a URI if:   You smoke.   You have chronic heart or lung disease.  You have a weakened defense (immune) system.   You are very young or very old.   You have nasal allergies or asthma.  You work in crowded or poorly ventilated areas.  You work in  health care facilities or schools. SIGNS AND SYMPTOMS  Symptoms typically develop 2-3 days after you come in contact with a cold virus. Most viral URIs last 7-10 days. However, viral URIs from the influenza virus (flu virus) can last 14-18 days and are typically more severe. Symptoms may include:   Runny or stuffy (congested) nose.   Sneezing.   Cough.   Sore throat.   Headache.   Fatigue.   Fever.   Loss of appetite.   Pain in your forehead, behind your eyes, and over your cheekbones (sinus pain).  Muscle aches.  DIAGNOSIS  Your health care provider may diagnose a URI by:  Physical exam.  Tests to check that your symptoms are not due to another condition such as:  Strep throat.  Sinusitis.  Pneumonia.  Asthma. TREATMENT  A URI goes away on its own with time. It cannot be cured with medicines, but medicines may be prescribed or recommended to relieve symptoms. Medicines may help:  Reduce your fever.  Reduce your cough.  Relieve nasal congestion. HOME CARE INSTRUCTIONS   Take medicines only as directed by your health care provider.   Gargle warm saltwater or take cough drops to comfort your throat as directed by your health care provider.  Use a warm mist humidifier or inhale steam from a shower to increase air moisture. This may make it easier to breathe.  Drink enough fluid to keep your urine clear or pale yellow.   Eat soups and other clear broths and maintain good  nutrition.   Rest as needed.   Return to work when your temperature has returned to normal or as your health care provider advises. You may need to stay home longer to avoid infecting others. You can also use a face mask and careful hand washing to prevent spread of the virus.  Increase the usage of your inhaler if you have asthma.   Do not use any tobacco products, including cigarettes, chewing tobacco, or electronic cigarettes. If you need help quitting, ask your health care  provider. PREVENTION  The best way to protect yourself from getting a cold is to practice good hygiene.   Avoid oral or hand contact with people with cold symptoms.   Wash your hands often if contact occurs.  There is no clear evidence that vitamin C, vitamin E, echinacea, or exercise reduces the chance of developing a cold. However, it is always recommended to get plenty of rest, exercise, and practice good nutrition.  SEEK MEDICAL CARE IF:   You are getting worse rather than better.   Your symptoms are not controlled by medicine.   You have chills.  You have worsening shortness of breath.  You have brown or red mucus.  You have yellow or brown nasal discharge.  You have pain in your face, especially when you bend forward.  You have a fever.  You have swollen neck glands.  You have pain while swallowing.  You have white areas in the back of your throat. SEEK IMMEDIATE MEDICAL CARE IF:   You have severe or persistent:  Headache.  Ear pain.  Sinus pain.  Chest pain.  You have chronic lung disease and any of the following:  Wheezing.  Prolonged cough.  Coughing up blood.  A change in your usual mucus.  You have a stiff neck.  You have changes in your:  Vision.  Hearing.  Thinking.  Mood. MAKE SURE YOU:   Understand these instructions.  Will watch your condition.  Will get help right away if you are not doing well or get worse.   This information is not intended to replace advice given to you by your health care provider. Make sure you discuss any questions you have with your health care provider.   Document Released: 10/13/2000 Document Revised: 09/03/2014 Document Reviewed: 07/25/2013 Elsevier Interactive Patient Education [employer].

## 2016-05-24 ENCOUNTER — Encounter: Payer: Self-pay | Admitting: Family Medicine

## 2016-05-25 ENCOUNTER — Encounter: Payer: Self-pay | Admitting: Internal Medicine

## 2016-05-25 ENCOUNTER — Ambulatory Visit (INDEPENDENT_AMBULATORY_CARE_PROVIDER_SITE_OTHER): Payer: 59 | Admitting: Internal Medicine

## 2016-05-25 VITALS — BP 122/78 | HR 86 | Temp 98.4°F | Wt 217.0 lb

## 2016-05-25 DIAGNOSIS — J020 Streptococcal pharyngitis: Secondary | ICD-10-CM | POA: Diagnosis not present

## 2016-05-25 DIAGNOSIS — J029 Acute pharyngitis, unspecified: Secondary | ICD-10-CM

## 2016-05-25 LAB — POCT RAPID STREP A (OFFICE): RAPID STREP A SCREEN: POSITIVE — AB

## 2016-05-25 MED ORDER — METHYLPREDNISOLONE ACETATE 80 MG/ML IJ SUSP
80.0000 mg | Freq: Once | INTRAMUSCULAR | Status: AC
  Administered 2016-05-25: 80 mg via INTRAMUSCULAR

## 2016-05-25 MED ORDER — AMOXICILLIN 500 MG PO CAPS: 500.0000 mg | ORAL_CAPSULE | Freq: Three times a day (TID) | ORAL | 0 refills | Status: DC

## 2016-05-25 NOTE — Progress Notes (Signed)
HPI  Pt presents to the clinic today with c/o headache, sore throat and cough. This started 2-3 days ago. He has had difficulty swallowing. The cough is productive of green mucous, mainly in the morning. He denies fever, chills, body aches or SOB. He has tried Tylenol and Sudafed with minimal relief. He does have a history of seasonal allergies. He has had sick contacts. He reports he did get his flu shot.  Review of Systems        Past Medical History:  Diagnosis Date  . Allergic rhinitis due to pollen    allergic rhinitis  . Hyperlipidemia   . Hypertension     Family History  Problem Relation Age of Onset  . Adopted: Yes    Social History   Social History  . Marital status: Married    Spouse name: N/A  . Number of children: 1  . 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 Smoking status: Never Smoker  . Smokeless tobacco: Never Used  . Alcohol use No  . Drug use: No  . Sexual activity: Yes   Other Topics Concern  . Not on file   Social History Narrative  . No narrative on file    Allergies  Allergen Reactions  . Ace Inhibitors     REACTION: cough  . Hydrocodone      Constitutional: Positive headache. Denies fatigue, fever or abrupt weight changes.  HEENT:  Positive sore throat. Denies eye redness, eye pain, pressure behind the eyes, facial pain, nasal congestion, ear pain, ringing in the ears, wax buildup, runny nose or bloody nose. Respiratory: Positive cough. Denies difficulty breathing or shortness of breath.  Cardiovascular: Denies chest pain, chest tightness, palpitations or swelling in the hands or feet.   No other specific complaints in a complete review of systems (except as listed in HPI above).  Objective:   BP 122/78   Pulse 86   Temp 98.4 F (36.9 C) (Oral)   Wt 217 lb (98.4 kg)   SpO2 98%   BMI 33.49 kg/m  Wt Readings from Last 3 Encounters:  05/25/16 217 lb (98.4 kg)   10/02/15 218 lb 8 oz (99.1 kg)  07/17/15 214 lb 8 oz (97.3 kg)     General: Appears his stated age,  in NAD. HEENT: Head: normal shape and size, no sinus tenderness noted; Throat/Mouth: Teeth present, mucosa erythematous and moist, no exudate noted, no lesions or ulcerations noted.  Neck: No cervical lymphadenopathy.  Cardiovascular: Normal rate and rhythm. S1,S2 noted.  No murmur, rubs or gallops noted.  Pulmonary/Chest: Normal effort and positive vesicular breath sounds. No respiratory distress. No wheezes, rales or ronchi noted.       Assessment & Plan:   Strep throat:  RST: positive: Get some rest and drink plenty of water Do salt water gargles for the sore throat eRx for Amoxil TID x 10 days 80 mg Depo IM  today  RTC as needed or if symptoms persist.   Webb Silversmith, NP

## 2016-05-25 NOTE — Patient Instructions (Signed)
Strep Throat Strep throat is a bacterial infection of the throat. Your health care provider may call the infection tonsillitis or pharyngitis, depending on whether there is swelling in the tonsils or at the back of the throat. Strep throat is most common during the cold months of the year in children who are 5-44 years of age, but it can happen during any season in people of any age. This infection is spread from person to person (contagious) through coughing, sneezing, or close contact. What are the causes? Strep throat is caused by the bacteria called Streptococcus pyogenes. What increases the risk? This condition is more likely to develop in:  People who spend time in crowded places where the infection can spread easily.  People who have close contact with someone who has strep throat.  What are the signs or symptoms? Symptoms of this condition include:  Fever or chills.  Redness, swelling, or pain in the tonsils or throat.  Pain or difficulty when swallowing.  White or yellow spots on the tonsils or throat.  Swollen, tender glands in the neck or under the jaw.  Red rash all over the body (rare).  How is this diagnosed? This condition is diagnosed by performing a rapid strep test or by taking a swab of your throat (throat culture test). Results from a rapid strep test are usually ready in a few minutes, but throat culture test results are available after one or two days. How is this treated? This condition is treated with antibiotic medicine. Follow these instructions at home: Medicines  Take over-the-counter and prescription medicines only as told by your health care provider.  Take your antibiotic as told by your health care provider. Do not stop taking the antibiotic even if you start to feel better.  Have family members who also have a sore throat or fever tested for strep throat. They may need antibiotics if they have the strep infection. Eating and drinking  Do not  share food, drinking cups, or personal items that could cause the infection to spread to other people.  If swallowing is difficult, try eating soft foods until your sore throat feels better.  Drink enough fluid to keep your urine clear or pale yellow. General instructions  Gargle with a salt-water mixture 3-4 times per day or as needed. To make a salt-water mixture, completely dissolve -1 tsp of salt in 1 cup of warm water.  Make sure that all household members wash their hands well.  Get plenty of rest.  Stay home from school or work until you have been taking antibiotics for 24 hours.  Keep all follow-up visits as told by your health care provider. This is important. Contact a health care provider if:  The glands in your neck continue to get bigger.  You develop a rash, cough, or earache.  You cough up a thick liquid that is green, yellow-brown, or bloody.  You have pain or discomfort that does not get better with medicine.  Your problems seem to be getting worse rather than better.  You have a fever. Get help right away if:  You have new symptoms, such as vomiting, severe headache, stiff or painful neck, chest pain, or shortness of breath.  You have severe throat pain, drooling, or changes in your voice.  You have swelling of the neck, or the skin on the neck becomes red and tender.  You have signs of dehydration, such as fatigue, dry mouth, and decreased urination.  You become increasingly sleepy, or   you cannot wake up completely.  Your joints become red or painful. This information is not intended to replace advice given to you by your health care provider. Make sure you discuss any questions you have with your health care provider. Document Released: 04/16/2000 Document Revised: 12/17/2015 Document Reviewed: 08/12/2014 Elsevier Interactive Patient Education  2017 Else[employer] Inc[employer]  

## 2016-09-25 ENCOUNTER — Other Ambulatory Visit: Payer: Self-pay | Admitting: Family Medicine

## 2016-11-26 DIAGNOSIS — L281 Prurigo nodularis: Secondary | ICD-10-CM | POA: Diagnosis not present

## 2016-11-30 ENCOUNTER — Encounter: Payer: Self-pay | Admitting: Family Medicine

## 2016-11-30 ENCOUNTER — Encounter: Payer: Self-pay | Admitting: Primary Care

## 2016-11-30 ENCOUNTER — Ambulatory Visit (INDEPENDENT_AMBULATORY_CARE_PROVIDER_SITE_OTHER)
Admission: RE | Admit: 2016-11-30 | Discharge: 2016-11-30 | Disposition: A | Discharge status: Home / Self Care | Payer: 59 | Source: Ambulatory Visit | Attending: Primary Care | Admitting: Primary Care

## 2016-11-30 ENCOUNTER — Ambulatory Visit (INDEPENDENT_AMBULATORY_CARE_PROVIDER_SITE_OTHER): Payer: 59 | Admitting: Primary Care

## 2016-11-30 VITALS — BP 142/90 | HR 70 | Temp 98.2°F | Ht 67.5 in | Wt 222.8 lb

## 2016-11-30 DIAGNOSIS — M79672 Pain in left foot: Secondary | ICD-10-CM

## 2016-11-30 DIAGNOSIS — M7732 Calcaneal spur, left foot: Secondary | ICD-10-CM | POA: Diagnosis not present

## 2016-11-30 MED ORDER — NAPROXEN 500 MG PO TABS: 500.0000 mg | ORAL_TABLET | Freq: Two times a day (BID) | ORAL | 0 refills | Status: DC

## 2016-11-30 NOTE — Progress Notes (Signed)
   Subjective:    Patient ID: Alex Anderson, male    DOB: 1970-12-31, 46 y.o.   MRN: [id number]  HPI  [name] is a 46 year old male who presents today with a chief complaint of heel pain. His pain is located to the plantar left heel that has been intermittent for the past 6-7 months. He's has preformed physical therapy exercises and takes Advil with some improvement. This morning at 3 am he woke up to use the bathroom and couldn't walk due to pain. He is limping today due to the pain. He stopped walking on his treadmill several months ago with some improvement. He has noticed the heel feeling "hot". He denies injury/trauma, joint swelling, open wounds, fevers.   Review of Systems  Constitutional: Negative for fever.  Musculoskeletal:       Left plantar heel pain  Skin: Negative for color change and wound.       Past Medical History:  Diagnosis Date  . Allergic rhinitis due to pollen    allergic rhinitis  . Hyperlipidemia   . Hypertension      Social History   Social History  . Marital status: Married    Spouse name: N/A  . Number of children: 1  . 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 Smoking status: Never Smoker  . Smokeless tobacco: Never Used  . Alcohol use No  . Drug use: No  . Sexual activity: Yes   Other Topics Concern  . Not on file   Social History Narrative  . No narrative on file    No past surgical history on file.  Family History  Problem Relation Age of Onset  . Adopted: Yes    Allergies  Allergen Reactions  . Ace Inhibitors     REACTION: cough  . Hydrocodone     Current Outpatient Prescriptions on File Prior to Visit  Medication Sig Dispense Refill  . amLODipine (NORVASC) 10 MG tablet TAKE 1 TABLET BY MOUTH  DAILY 90 tablet 0  . fluticasone (FLONASE) 50 MCG/ACT nasal spray Place 2 sprays into both nostrils as needed. 16 g 5   No current facility-administered  medications on file prior to visit.     BP (!) 142/90   Pulse 70   Temp 98.2 F (36.8 C) (Oral)   Ht 5' 7.5" (1.715 m)   Wt 222 lb 12.8 oz (101.1 kg)   SpO2 97%   BMI 34.38 kg/m    Objective:   Physical Exam  Constitutional: He appears well-nourished.  Neck: Neck supple.  Cardiovascular: Normal rate.   Pulmonary/Chest: Effort normal.  Musculoskeletal:       Feet:  Tenderness to plantar heel as noted below. No erythema. No swelling. No open wounds. No joint swelling or tenderness. Normal ROM to left foot.  Skin: Skin is warm and dry. No erythema.          Assessment & Plan:  Plantar Heel Pain:  Located to left side x 6-7 months. No improvement with physical therapy and home interventions. Will start with xray to rule out stress fracture, heel spur, other abnormality. Suspect pain secondary to heel spur.  Recommended he had insoles to his shoes, consider heel cups. Rx for Naproxen 500 mg BID sent to pharmacy. Consider Podiatry referral for further evaluation. Will await xray.  Sheral Flow, NP

## 2016-11-30 NOTE — Patient Instructions (Signed)
Complete xray(s) prior to leaving today.   You may take Naproxen twice daily with food as needed for pain.  I'll be in touch soon!  It was a pleasure meeting you!

## 2016-12-15 ENCOUNTER — Other Ambulatory Visit: Payer: Self-pay | Admitting: Family Medicine

## 2017-10-09 ENCOUNTER — Other Ambulatory Visit: Payer: Self-pay | Admitting: Family Medicine

## 2017-10-27 ENCOUNTER — Other Ambulatory Visit (INDEPENDENT_AMBULATORY_CARE_PROVIDER_SITE_OTHER): Payer: 59

## 2017-10-27 DIAGNOSIS — Z0000 Encounter for general adult medical examination without abnormal findings: Secondary | ICD-10-CM | POA: Diagnosis not present

## 2017-10-27 LAB — CBC WITH DIFFERENTIAL/PLATELET
BASOS PCT: 0.3 % (ref 0.0–3.0)
Basophils Absolute: 0 10*3/uL (ref 0.0–0.1)
EOS PCT: 1.6 % (ref 0.0–5.0)
Eosinophils Absolute: 0.1 10*3/uL (ref 0.0–0.7)
HCT: 44.6 % (ref 39.0–52.0)
Hemoglobin: 15.2 g/dL (ref 13.0–17.0)
LYMPHS ABS: 2.2 10*3/uL (ref 0.7–4.0)
Lymphocytes Relative: 34.7 % (ref 12.0–46.0)
MCHC: 34.1 g/dL (ref 30.0–36.0)
MCV: 86.3 fl (ref 78.0–100.0)
MONOS PCT: 9.5 % (ref 3.0–12.0)
Monocytes Absolute: 0.6 10*3/uL (ref 0.1–1.0)
NEUTROS ABS: 3.4 10*3/uL (ref 1.4–7.7)
NEUTROS PCT: 53.9 % (ref 43.0–77.0)
PLATELETS: 190 10*3/uL (ref 150.0–400.0)
RBC: 5.16 Mil/uL (ref 4.22–5.81)
RDW: 13.5 % (ref 11.5–15.5)
WBC: 6.3 10*3/uL (ref 4.0–10.5)

## 2017-10-27 LAB — LIPID PANEL
CHOLESTEROL: 177 mg/dL (ref 0–200)
HDL: 46.2 mg/dL (ref >39.00)
LDL CALC: 102 mg/dL — AB (ref 0–99)
NonHDL: 131.27
Total CHOL/HDL Ratio: 4
Triglycerides: 144 mg/dL (ref 0.0–149.0)
VLDL: 28.8 mg/dL (ref 0.0–40.0)

## 2017-10-27 LAB — BASIC METABOLIC PANEL
BUN: 17 mg/dL (ref 6–23)
CO2: 30 meq/L (ref 19–32)
Calcium: 9.4 mg/dL (ref 8.4–10.5)
Chloride: 102 mEq/L (ref 96–112)
Creatinine, Ser: 1.03 mg/dL (ref 0.40–1.50)
GFR: 83.02 mL/min (ref >60.00)
GLUCOSE: 101 mg/dL — AB (ref 70–99)
POTASSIUM: 4 meq/L (ref 3.5–5.1)
SODIUM: 138 meq/L (ref 135–145)

## 2017-10-27 LAB — HEPATIC FUNCTION PANEL
ALT: 40 U/L (ref 0–53)
AST: 31 U/L (ref 0–37)
Albumin: 4.3 g/dL (ref 3.5–5.2)
Alkaline Phosphatase: 79 U/L (ref 39–117)
BILIRUBIN DIRECT: 0.1 mg/dL (ref 0.0–0.3)
BILIRUBIN TOTAL: 0.8 mg/dL (ref 0.2–1.2)
Total Protein: 7.5 g/dL (ref 6.0–8.3)

## 2017-10-27 LAB — PSA: PSA: 0.6 ng/mL (ref 0.10–4.00)

## 2017-10-30 NOTE — Progress Notes (Signed)
Dr. Frederico Hamman T. Sigourney Portillo, MD, [facility] [place], [address] Phone: [phone] Fax: [phone]  10/31/2017  Patient: Alex Anderson, MRN: [id number], DOB: Jun 19, 1970, 47 y.o.  Primary Physician:  Owens Loffler, MD   Chief Complaint  Patient presents with  . Annual Exam   Subjective:   Alex Anderson is a 47 y.o. pleasant patient who presents with the following:  Preventative Health Maintenance Visit:  Health Maintenance Summary Reviewed and updated, unless pt declines services.  Tobacco History Reviewed. Alcohol: No concerns, no excessive use Exercise Habits: Some activity, rec at least 30 mins 5 times a week STD concerns: no risk or activity to increase risk Drug Use: None Encouraged self-testicular check  2 children - last year had some PF, has some good heel spurs.   Wt Readings from Last 3 Encounters:  10/31/17 225 lb 8 oz (102.3 kg)  11/30/16 222 lb 12.8 oz (101.1 kg)  05/25/16 217 lb (98.4 kg)    Health Maintenance  Topic Date Due  . HIV Screening  11/11/1987  . INFLUENZA VACCINE  12/01/2017  . TETANUS/TDAP  07/16/2025   Immunization History  Administered Date(s) Administered  . Influenza,inj,Quad PF,6+ Mos 03/11/2014  . PPD Test 03/01/2014, 08/17/2016  . Tdap 07/17/2015   Patient Active Problem List   Diagnosis Date Noted  . Allergic rhinitis due to pollen 11/06/2010  . Screening for diabetes mellitus 11/05/2010  . HYPERLIPIDEMIA 01/22/2008  . HYPERTENSION 01/22/2008   Past Medical History:  Diagnosis Date  . Allergic rhinitis due to pollen    allergic rhinitis  . Hyperlipidemia   . Hypertension    History reviewed. No pertinent surgical history. Social History   Socioeconomic History  . Marital status: Married    Spouse name: Not on file  . Number of children: 1  . Years of education: Not on file  . Highest education level: Not on file  Occupational History  . Occupation: PTA     Comment: [employer]  Social Needs  . Financial resource strain: Not on file  . Food insecurity:    Worry: Not on file    Inability: Not on file  . Transportation needs:    Medical: Not on file    Non-medical: Not on file  Tobacco Use  . Smoking status: Never Smoker  . Smokeless tobacco: Never Used  Substance and Sexual Activity  . Alcohol use: No    Alcohol/week: 0.0 oz  . Drug use: No  . Sexual activity: Yes  Lifestyle  . Physical activity:    Days per week: Not on file    Minutes per session: Not on file  . Stress: Not on file  Relationships  . Social connections:    Talks on phone: Not on file    Gets together: Not on file    Attends religious service: Not on file    Active member of club or organization: Not on file    Attends meetings of clubs or organizations: Not on file    Relationship status: Not on file  . Intimate partner violence:    Fear of current or ex partner: Not on file    Emotionally abused: Not on file    Physically abused: Not on file    Forced sexual activity: Not on file  Other Topics Concern  . Not on file  Social History Narrative  . Not on file   Family History  Adopted: Yes   Allergies  Allergen Reactions  . Ace Inhibitors     REACTION: cough  . Hydrocodone     Medication list has been reviewed and updated.   General: Denies fever, chills, sweats. No significant weight loss. Eyes: Denies blurring,significant itching ENT: Denies earache, sore throat, and hoarseness. Cardiovascular: Denies chest pains, palpitations, dyspnea on exertion Respiratory: Denies cough, dyspnea at rest,wheeezing Breast: no concerns about lumps GI: Denies nausea, vomiting, diarrhea, constipation, change in bowel habits, abdominal pain, melena, hematochezia GU: Denies penile discharge, ED, urinary flow / outflow problems. No STD concerns. Musculoskeletal: Denies back pain, joint pain Derm: Denies rash, itching Neuro: Denies  paresthesias, frequent  falls, frequent headaches Psych: Denies depression, anxiety Endocrine: Denies cold intolerance, heat intolerance, polydipsia Heme: Denies enlarged lymph nodes Allergy: No hayfever  Objective:   BP 120/84   Pulse 80   Temp 97.7 F (36.5 C) (Oral)   Ht 5' 7.5" (1.715 m)   Wt 225 lb 8 oz (102.3 kg)   BMI 34.80 kg/m  Ideal Body Weight: Weight in (lb) to have BMI = 25: 161.7  No exam data present  GEN: well developed, well nourished, no acute distress Eyes: conjunctiva and lids normal, PERRLA, EOMI ENT: TM clear, nares clear, oral exam WNL Neck: supple, no lymphadenopathy, no thyromegaly, no JVD Pulm: clear to auscultation and percussion, respiratory effort normal CV: regular rate and rhythm, S1-S2, no murmur, rub or gallop, no bruits, peripheral pulses normal and symmetric, no cyanosis, clubbing, edema or varicosities GI: soft, non-tender; no hepatosplenomegaly, masses; active bowel sounds all quadrants GU: no hernia, testicular mass, penile discharge Lymph: no cervical, axillary or inguinal adenopathy MSK: gait normal, muscle tone and strength WNL, no joint swelling, effusions, discoloration, crepitus  SKIN: clear, good turgor, color WNL, no rashes, lesions, or ulcerations Neuro: normal mental status, normal strength, sensation, and motion Psych: alert; oriented to person, place and time, normally interactive and not anxious or depressed in appearance.  All labs reviewed with patient.  Lipids:    Component Value Date/Time   CHOL 177 10/27/2017 0735   TRIG 144.0 10/27/2017 0735   HDL 46.20 10/27/2017 0735   LDLDIRECT 146.0 02/26/2013 0923   VLDL 28.8 10/27/2017 0735   CHOLHDL 4 10/27/2017 0735   CBC: CBC Latest Ref Rng & Units 10/27/2017 07/14/2015 02/26/2013  WBC 4.0 - 10.5 K/uL 6.3 6.5 7.2  Hemoglobin 13.0 - 17.0 g/dL 15.2 15.0 15.6  Hematocrit 39.0 - 52.0 % 44.6 45.2 46.1  Platelets 150.0 - 400.0 K/uL 190.0 184.0 767.3    Basic Metabolic Panel:    Component Value  Date/Time   NA 138 10/27/2017 0735   K 4.0 10/27/2017 0735   CL 102 10/27/2017 0735   CO2 30 10/27/2017 0735   BUN 17 10/27/2017 0735   CREATININE 1.03 10/27/2017 0735   GLUCOSE 101 (H) 10/27/2017 0735   CALCIUM 9.4 10/27/2017 0735   Hepatic Function Latest Ref Rng & Units 10/27/2017 07/14/2015 03/01/2014  Total Protein 6.0 - 8.3 g/dL 7.5 7.2 7.7  Albumin 3.5 - 5.2 g/dL 4.3 4.2 3.6  AST 0 - 37 U/L 31 26 36  ALT 0 - 53 U/L 40 39 48  Alk Phosphatase 39 - 117 U/L 79 53 56  Total Bilirubin 0.2 - 1.2 mg/dL 0.8 0.8 0.9  Bilirubin, Direct 0.0 - 0.3 mg/dL 0.1 0.1 0.0    Lab Results  Component Value Date   TSH 1.45 09/30/2009   Lab Results  Component Value Date   PSA 0.60 10/27/2017  Assessment and Plan:   Healthcare maintenance  Doing well overall, work on weight  Health Maintenance Exam: The patient's preventative maintenance and recommended screening tests for an annual wellness exam were reviewed in full today. Brought up to date unless services declined.  Counselled on the importance of diet, exercise, and its role in overall health and mortality. The patient's FH and SH was reviewed, including their home life, tobacco status, and drug and alcohol status.  Follow-up in 1 year for physical exam or additional follow-up below.  Follow-up: No follow-ups on file. Or follow-up in 1 year if not noted.  Signed,  Maud Deed. Kole Hilyard, MD   Allergies as of 10/31/2017      Reactions   Ace Inhibitors    REACTION: cough   Hydrocodone       Medication List        Accurate as of 10/31/17  9:52 AM. Always use your most recent med list.          amLODipine 10 MG tablet Commonly known as:  NORVASC TAKE 1 TABLET BY MOUTH  DAILY   atorvastatin 40 MG tablet Commonly known as:  LIPITOR Take 40 mg by mouth daily.   fluticasone 50 MCG/ACT nasal spray Commonly known as:  FLONASE Place 2 sprays into both nostrils as needed.

## 2017-10-31 ENCOUNTER — Ambulatory Visit (INDEPENDENT_AMBULATORY_CARE_PROVIDER_SITE_OTHER): Payer: 59 | Admitting: Family Medicine

## 2017-10-31 ENCOUNTER — Encounter: Payer: Self-pay | Admitting: Family Medicine

## 2017-10-31 ENCOUNTER — Other Ambulatory Visit: Payer: Self-pay

## 2017-10-31 VITALS — BP 120/84 | HR 80 | Temp 97.7°F | Ht 67.5 in | Wt 225.5 lb

## 2017-10-31 DIAGNOSIS — Z0000 Encounter for general adult medical examination without abnormal findings: Secondary | ICD-10-CM

## 2017-11-25 DIAGNOSIS — L281 Prurigo nodularis: Secondary | ICD-10-CM | POA: Diagnosis not present

## 2017-11-25 DIAGNOSIS — D2261 Melanocytic nevi of right upper limb, including shoulder: Secondary | ICD-10-CM | POA: Diagnosis not present

## 2017-11-25 DIAGNOSIS — D2262 Melanocytic nevi of left upper limb, including shoulder: Secondary | ICD-10-CM | POA: Diagnosis not present

## 2017-12-03 ENCOUNTER — Other Ambulatory Visit: Payer: Self-pay | Admitting: Family Medicine

## 2018-03-28 ENCOUNTER — Other Ambulatory Visit: Payer: Self-pay | Admitting: *Deleted

## 2018-03-28 MED ORDER — ATORVASTATIN CALCIUM 40 MG PO TABS: 40.0000 mg | ORAL_TABLET | Freq: Every day | ORAL | 1 refills | Status: DC

## 2018-05-09 DIAGNOSIS — L538 Other specified erythematous conditions: Secondary | ICD-10-CM | POA: Diagnosis not present

## 2018-05-09 DIAGNOSIS — L820 Inflamed seborrheic keratosis: Secondary | ICD-10-CM | POA: Diagnosis not present

## 2018-08-30 ENCOUNTER — Other Ambulatory Visit: Payer: Self-pay | Admitting: Family Medicine

## 2018-11-23 ENCOUNTER — Other Ambulatory Visit: Payer: Self-pay | Admitting: Family Medicine

## 2018-11-24 NOTE — Telephone Encounter (Signed)
Please call patient and schedule annual physical and lab work. Send back to [facility] for refill after appointments are scheduled.

## 2018-11-28 ENCOUNTER — Telehealth: Payer: Self-pay | Admitting: *Deleted

## 2018-11-28 MED ORDER — ATORVASTATIN CALCIUM 40 MG PO TABS: 40.0000 mg | ORAL_TABLET | Freq: Every day | ORAL | 0 refills | Status: DC

## 2018-11-28 MED ORDER — AMLODIPINE BESYLATE 10 MG PO TABS: 10.0000 mg | ORAL_TABLET | Freq: Every day | ORAL | 0 refills | Status: DC

## 2018-11-28 NOTE — Telephone Encounter (Signed)
Please schedule CPE with fasting labs for Dr. Copland. 

## 2018-11-30 NOTE — Telephone Encounter (Signed)
Labs 10/2 cpx 10/5 Pt aware

## 2018-12-05 NOTE — Telephone Encounter (Signed)
Labs 10/2 cpx  10/5 Pt aware

## 2019-02-02 ENCOUNTER — Other Ambulatory Visit: Payer: Self-pay

## 2019-02-02 ENCOUNTER — Other Ambulatory Visit (INDEPENDENT_AMBULATORY_CARE_PROVIDER_SITE_OTHER): Payer: 59

## 2019-02-02 DIAGNOSIS — Z0000 Encounter for general adult medical examination without abnormal findings: Secondary | ICD-10-CM

## 2019-02-02 DIAGNOSIS — Z131 Encounter for screening for diabetes mellitus: Secondary | ICD-10-CM

## 2019-02-02 DIAGNOSIS — Z125 Encounter for screening for malignant neoplasm of prostate: Secondary | ICD-10-CM | POA: Diagnosis not present

## 2019-02-02 LAB — HEPATIC FUNCTION PANEL
ALT: 35 U/L (ref 0–53)
AST: 26 U/L (ref 0–37)
Albumin: 4.2 g/dL (ref 3.5–5.2)
Alkaline Phosphatase: 69 U/L (ref 39–117)
Bilirubin, Direct: 0.1 mg/dL (ref 0.0–0.3)
Total Bilirubin: 0.6 mg/dL (ref 0.2–1.2)
Total Protein: 7.4 g/dL (ref 6.0–8.3)

## 2019-02-02 LAB — LIPID PANEL
Cholesterol: 168 mg/dL (ref 0–200)
HDL: 47.2 mg/dL (ref >39.00)
LDL Cholesterol: 99 mg/dL (ref 0–99)
NonHDL: 121
Total CHOL/HDL Ratio: 4
Triglycerides: 110 mg/dL (ref 0.0–149.0)
VLDL: 22 mg/dL (ref 0.0–40.0)

## 2019-02-02 LAB — CBC WITH DIFFERENTIAL/PLATELET
Basophils Absolute: 0 10*3/uL (ref 0.0–0.1)
Basophils Relative: 0.5 % (ref 0.0–3.0)
Eosinophils Absolute: 0.1 10*3/uL (ref 0.0–0.7)
Eosinophils Relative: 2.2 % (ref 0.0–5.0)
HCT: 45.3 % (ref 39.0–52.0)
Hemoglobin: 14.9 g/dL (ref 13.0–17.0)
Lymphocytes Relative: 37.4 % (ref 12.0–46.0)
Lymphs Abs: 2.2 10*3/uL (ref 0.7–4.0)
MCHC: 32.9 g/dL (ref 30.0–36.0)
MCV: 88.6 fl (ref 78.0–100.0)
Monocytes Absolute: 0.6 10*3/uL (ref 0.1–1.0)
Monocytes Relative: 9.4 % (ref 3.0–12.0)
Neutro Abs: 3 10*3/uL (ref 1.4–7.7)
Neutrophils Relative %: 50.5 % (ref 43.0–77.0)
Platelets: 183 10*3/uL (ref 150.0–400.0)
RBC: 5.12 Mil/uL (ref 4.22–5.81)
RDW: 13.5 % (ref 11.5–15.5)
WBC: 5.9 10*3/uL (ref 4.0–10.5)

## 2019-02-02 LAB — BASIC METABOLIC PANEL
BUN: 20 mg/dL (ref 6–23)
CO2: 30 mEq/L (ref 19–32)
Calcium: 9.5 mg/dL (ref 8.4–10.5)
Chloride: 103 mEq/L (ref 96–112)
Creatinine, Ser: 1.02 mg/dL (ref 0.40–1.50)
GFR: 78.55 mL/min (ref >60.00)
Glucose, Bld: 96 mg/dL (ref 70–99)
Potassium: 4.1 mEq/L (ref 3.5–5.1)
Sodium: 141 mEq/L (ref 135–145)

## 2019-02-02 LAB — HEMOGLOBIN A1C: Hgb A1c MFr Bld: 6.2 % (ref 4.6–6.5)

## 2019-02-02 LAB — PSA: PSA: 0.5 ng/mL (ref 0.10–4.00)

## 2019-02-02 NOTE — Addendum Note (Signed)
Addended by: Ellamae Sia on: 02/02/2019 12:11 PM   Modules accepted: Orders

## 2019-02-04 NOTE — Progress Notes (Signed)
Alex Anderson T. Alex Giuliani, MD Primary Care and [place] at [facility] [place], [address] Phone: [phone]  FAX: [phone]  Alex Anderson - 48 y.o. male  MRN [id number]  Date of Birth: [birth year]  Visit Date: 02/05/2019  PCP: Owens Loffler, MD  Referred by: Owens Loffler, MD  Chief Complaint  Patient presents with  . Annual Exam   Patient Care Team: Owens Loffler, MD as PCP - General Subjective:   Alex Anderson is a 48 y.o. pleasant patient who presents with the following:  Preventative Health Maintenance Visit:  Health Maintenance Summary Reviewed and updated, unless pt declines services.  Tobacco History Reviewed. Alcohol: No concerns, no excessive use Exercise Habits: Some activity, rec at least 30 mins 5 times a week STD concerns: no risk or activity to increase risk Drug Use: None Encouraged self-testicular check  Was back into racqutball. Lifting some with his neighbors.  No woking out in about a month.    Health Maintenance  Topic Date Due  . HIV Screening  11/11/1987  . TETANUS/TDAP  07/16/2025  . INFLUENZA VACCINE  Completed   Immunization History  Administered Date(s) Administered  . Influenza,inj,Quad PF,6+ Mos 03/11/2014, 02/05/2019  . PPD Test 03/01/2014, 08/17/2016  . Tdap 07/17/2015   Patient Active Problem List   Diagnosis Date Noted  . Allergic rhinitis due to pollen 11/06/2010  . Screening for diabetes mellitus 11/05/2010  . HYPERLIPIDEMIA 01/22/2008  . HYPERTENSION 01/22/2008   Past Medical History:  Diagnosis Date  . Allergic rhinitis due to pollen    allergic rhinitis  . Hyperlipidemia   . Hypertension    History reviewed. No pertinent surgical history. Social History   Socioeconomic History  . Marital status: Married    Spouse name: Not on file  . Number of children: 1  . Years of education: Not on file  . Highest education level: Not on file   Occupational History  . Occupation: PTA    Comment: [employer]  Social Needs  . Financial resource strain: Not on file  . Food insecurity    Worry: Not on file    Inability: Not on file  . Transportation needs    Medical: Not on file    Non-medical: Not on file  Tobacco Use  . Smoking status: Never Smoker  . Smokeless tobacco: Never Used  Substance and Sexual Activity  . Alcohol use: No    Alcohol/week: 0.0 standard drinks  . Drug use: No  . Sexual activity: Yes  Lifestyle  . Physical activity    Days per week: Not on file    Minutes per session: Not on file  . Stress: Not on file  Relationships  . Social Herbalist on phone: Not on file    Gets together: Not on file    Attends religious service: Not on file    Active member of club or organization: Not on file    Attends meetings of clubs or organizations: Not on file    Relationship status: Not on file  . Intimate partner violence    Fear of current or ex partner: Not on file    Emotionally abused: Not on file    Physically abused: Not on file    Forced sexual activity: Not on file  Other Topics Concern  . Not on file  Social History Narrative  . Not on file   Family History  Adopted: Yes   Allergies  Allergen Reactions  . Ace Inhibitors     REACTION: cough  . Hydrocodone     Medication list has been reviewed and updated.   General: Denies fever, chills, sweats. No significant weight loss. Eyes: Denies blurring,significant itching ENT: Denies earache, sore throat, and hoarseness. Cardiovascular: Denies chest pains, palpitations, dyspnea on exertion Respiratory: Denies cough, dyspnea at rest,wheeezing Breast: no concerns about lumps GI: Denies nausea, vomiting, diarrhea, constipation, change in bowel habits, abdominal pain, melena, hematochezia GU: Denies penile discharge, ED, urinary flow / outflow problems. No STD concerns. Musculoskeletal: Denies back pain, joint pain Derm: Denies rash,  itching Neuro: Denies  paresthesias, frequent falls, frequent headaches Psych: Denies depression, anxiety Endocrine: Denies cold intolerance, heat intolerance, polydipsia Heme: Denies enlarged lymph nodes Allergy: No hayfever  Objective:   BP 120/86   Pulse 75   Temp 98.1 F (36.7 C) (Temporal)   Ht 5' 7.5" (1.715 m)   Wt 221 lb 4 oz (100.4 kg)   SpO2 98%   BMI 34.14 kg/m  Ideal Body Weight: Weight in (lb) to have BMI = 25: 161.7  Ideal Body Weight: Weight in (lb) to have BMI = 25: 161.7 No exam data present Depression screen [facility] 2/9 02/05/2019 10/31/2017 11/30/2016  Decreased Interest 0 0 0  Down, Depressed, Hopeless 0 0 0  PHQ - 2 Score 0 0 0     GEN: well developed, well nourished, no acute distress Eyes: conjunctiva and lids normal, PERRLA, EOMI ENT: TM clear, nares clear, oral exam WNL Neck: supple, no lymphadenopathy, no thyromegaly, no JVD Pulm: clear to auscultation and percussion, respiratory effort normal CV: regular rate and rhythm, S1-S2, no murmur, rub or gallop, no bruits, peripheral pulses normal and symmetric, no cyanosis, clubbing, edema or varicosities GI: soft, non-tender; no hepatosplenomegaly, masses; active bowel sounds all quadrants GU: no hernia, testicular mass, penile discharge Lymph: no cervical, axillary or inguinal adenopathy MSK: gait normal, muscle tone and strength WNL, no joint swelling, effusions, discoloration, crepitus  SKIN: clear, good turgor, color WNL, no rashes, lesions, or ulcerations Neuro: normal mental status, normal strength, sensation, and motion Psych: alert; oriented to person, place and time, normally interactive and not anxious or depressed in appearance.  All labs reviewed with patient. Results for orders placed or performed in visit on 02/02/19  CBC with Differential/Platelet  Result Value Ref Range   WBC 5.9 4.0 - 10.5 K/uL   RBC 5.12 4.22 - 5.81 Mil/uL   Hemoglobin 14.9 13.0 - 17.0 g/dL   HCT 45.3 39.0 - 52.0 %    MCV 88.6 78.0 - 100.0 fl   MCHC 32.9 30.0 - 36.0 g/dL   RDW 13.5 11.5 - 15.5 %   Platelets 183.0 150.0 - 400.0 K/uL   Neutrophils Relative % 50.5 43.0 - 77.0 %   Lymphocytes Relative 37.4 12.0 - 46.0 %   Monocytes Relative 9.4 3.0 - 12.0 %   Eosinophils Relative 2.2 0.0 - 5.0 %   Basophils Relative 0.5 0.0 - 3.0 %   Neutro Abs 3.0 1.4 - 7.7 K/uL   Lymphs Abs 2.2 0.7 - 4.0 K/uL   Monocytes Absolute 0.6 0.1 - 1.0 K/uL   Eosinophils Absolute 0.1 0.0 - 0.7 K/uL   Basophils Absolute 0.0 0.0 - 0.1 K/uL  Hemoglobin A1c  Result Value Ref Range   Hgb A1c MFr Bld 6.2 4.6 - 6.5 %  PSA  Result Value Ref Range   PSA 0.50 0.10 - 4.00 ng/mL  Basic metabolic panel  Result Value  Ref Range   Sodium 141 135 - 145 mEq/L   Potassium 4.1 3.5 - 5.1 mEq/L   Chloride 103 96 - 112 mEq/L   CO2 30 19 - 32 mEq/L   Glucose, Bld 96 70 - 99 mg/dL   BUN 20 6 - 23 mg/dL   Creatinine, Ser 1.02 0.40 - 1.50 mg/dL   Calcium 9.5 8.4 - 10.5 mg/dL   GFR 78.55 >60.00 mL/min  Hepatic function panel  Result Value Ref Range   Total Bilirubin 0.6 0.2 - 1.2 mg/dL   Bilirubin, Direct 0.1 0.0 - 0.3 mg/dL   Alkaline Phosphatase 69 39 - 117 U/L   AST 26 0 - 37 U/L   ALT 35 0 - 53 U/L   Total Protein 7.4 6.0 - 8.3 g/dL   Albumin 4.2 3.5 - 5.2 g/dL  Lipid panel  Result Value Ref Range   Cholesterol 168 0 - 200 mg/dL   Triglycerides 110.0 0.0 - 149.0 mg/dL   HDL 47.20 >39.00 mg/dL   VLDL 22.0 0.0 - 40.0 mg/dL   LDL Cholesterol 99 0 - 99 mg/dL   Total CHOL/HDL Ratio 4    NonHDL 121.00     Assessment and Plan:     ICD-10-CM   1. Healthcare maintenance  Z00.00   2. Need for influenza vaccination  Z23 Flu Vaccine QUAD 6+ mos PF IM (Fluarix Quad PF)   Doing really well.  Work on weight loss and diet.   Health Maintenance Exam: The patient's preventative maintenance and recommended screening tests for an annual wellness exam were reviewed in full today. Brought up to date unless services declined.  Counselled on  the importance of diet, exercise, and its role in overall health and mortality. The patient's FH and SH was reviewed, including their home life, tobacco status, and drug and alcohol status.  Follow-up in 1 year for physical exam or additional follow-up below.  Follow-up: No follow-ups on file. Or follow-up in 1 year if not noted.  No orders of the defined types were placed in this encounter.  There are no discontinued medications. Orders Placed This Encounter  Procedures  . Flu Vaccine QUAD 6+ mos PF IM (Fluarix Quad PF)    Signed,  Deshante Cassell T. Keyanah Kozicki, MD   Allergies as of 02/05/2019      Reactions   Ace Inhibitors    REACTION: cough   Hydrocodone       Medication List       Accurate as of February 05, 2019  9:18 AM. If you have any questions, ask your nurse or doctor.        amLODipine 10 MG tablet Commonly known as: NORVASC Take 1 tablet (10 mg total) by mouth daily.   atorvastatin 40 MG tablet Commonly known as: LIPITOR Take 1 tablet (40 mg total) by mouth daily.   fluticasone 50 MCG/ACT nasal spray Commonly known as: FLONASE Place 2 sprays into both nostrils as needed.

## 2019-02-05 ENCOUNTER — Other Ambulatory Visit: Payer: Self-pay

## 2019-02-05 ENCOUNTER — Encounter: Payer: Self-pay | Admitting: Family Medicine

## 2019-02-05 ENCOUNTER — Ambulatory Visit (INDEPENDENT_AMBULATORY_CARE_PROVIDER_SITE_OTHER): Payer: 59 | Admitting: Family Medicine

## 2019-02-05 VITALS — BP 120/86 | HR 75 | Temp 98.1°F | Ht 67.5 in | Wt 221.2 lb

## 2019-02-05 DIAGNOSIS — Z23 Encounter for immunization: Secondary | ICD-10-CM | POA: Diagnosis not present

## 2019-02-05 DIAGNOSIS — Z0000 Encounter for general adult medical examination without abnormal findings: Secondary | ICD-10-CM

## 2019-02-05 MED ORDER — AMLODIPINE BESYLATE 10 MG PO TABS: 10.0000 mg | ORAL_TABLET | Freq: Every day | ORAL | 3 refills | Status: DC

## 2019-02-05 MED ORDER — ATORVASTATIN CALCIUM 40 MG PO TABS: 40.0000 mg | ORAL_TABLET | Freq: Every day | ORAL | 3 refills | Status: DC

## 2020-01-06 ENCOUNTER — Other Ambulatory Visit: Payer: Self-pay | Admitting: Family Medicine

## 2020-01-23 ENCOUNTER — Other Ambulatory Visit: Payer: Self-pay | Admitting: Family Medicine

## 2020-01-23 DIAGNOSIS — Z79899 Other long term (current) drug therapy: Secondary | ICD-10-CM

## 2020-01-23 DIAGNOSIS — E785 Hyperlipidemia, unspecified: Secondary | ICD-10-CM

## 2020-01-23 DIAGNOSIS — Z125 Encounter for screening for malignant neoplasm of prostate: Secondary | ICD-10-CM

## 2020-01-23 DIAGNOSIS — Z131 Encounter for screening for diabetes mellitus: Secondary | ICD-10-CM

## 2020-02-04 ENCOUNTER — Other Ambulatory Visit: Payer: 59

## 2020-02-06 ENCOUNTER — Encounter: Payer: 59 | Admitting: Family Medicine

## 2020-02-27 ENCOUNTER — Other Ambulatory Visit: Payer: Self-pay

## 2020-02-27 ENCOUNTER — Other Ambulatory Visit (INDEPENDENT_AMBULATORY_CARE_PROVIDER_SITE_OTHER): Payer: 59

## 2020-02-27 DIAGNOSIS — Z79899 Other long term (current) drug therapy: Secondary | ICD-10-CM | POA: Diagnosis not present

## 2020-02-27 DIAGNOSIS — Z131 Encounter for screening for diabetes mellitus: Secondary | ICD-10-CM

## 2020-02-27 DIAGNOSIS — E785 Hyperlipidemia, unspecified: Secondary | ICD-10-CM

## 2020-02-27 DIAGNOSIS — Z125 Encounter for screening for malignant neoplasm of prostate: Secondary | ICD-10-CM | POA: Diagnosis not present

## 2020-02-27 LAB — CBC WITH DIFFERENTIAL/PLATELET
Basophils Absolute: 0 10*3/uL (ref 0.0–0.1)
Basophils Relative: 0.4 % (ref 0.0–3.0)
Eosinophils Absolute: 0.1 10*3/uL (ref 0.0–0.7)
Eosinophils Relative: 1.9 % (ref 0.0–5.0)
HCT: 45.8 % (ref 39.0–52.0)
Hemoglobin: 15.3 g/dL (ref 13.0–17.0)
Lymphocytes Relative: 32.2 % (ref 12.0–46.0)
Lymphs Abs: 2 10*3/uL (ref 0.7–4.0)
MCHC: 33.4 g/dL (ref 30.0–36.0)
MCV: 87.6 fl (ref 78.0–100.0)
Monocytes Absolute: 0.5 10*3/uL (ref 0.1–1.0)
Monocytes Relative: 7.4 % (ref 3.0–12.0)
Neutro Abs: 3.7 10*3/uL (ref 1.4–7.7)
Neutrophils Relative %: 58.1 % (ref 43.0–77.0)
Platelets: 177 10*3/uL (ref 150.0–400.0)
RBC: 5.23 Mil/uL (ref 4.22–5.81)
RDW: 13.7 % (ref 11.5–15.5)
WBC: 6.3 10*3/uL (ref 4.0–10.5)

## 2020-02-27 LAB — HEPATIC FUNCTION PANEL
ALT: 52 U/L (ref 0–53)
AST: 35 U/L (ref 0–37)
Albumin: 4.2 g/dL (ref 3.5–5.2)
Alkaline Phosphatase: 64 U/L (ref 39–117)
Bilirubin, Direct: 0.1 mg/dL (ref 0.0–0.3)
Total Bilirubin: 0.7 mg/dL (ref 0.2–1.2)
Total Protein: 7.4 g/dL (ref 6.0–8.3)

## 2020-02-27 LAB — LIPID PANEL
Cholesterol: 164 mg/dL (ref 0–200)
HDL: 46.7 mg/dL (ref >39.00)
LDL Cholesterol: 96 mg/dL (ref 0–99)
NonHDL: 117.24
Total CHOL/HDL Ratio: 4
Triglycerides: 107 mg/dL (ref 0.0–149.0)
VLDL: 21.4 mg/dL (ref 0.0–40.0)

## 2020-02-27 LAB — BASIC METABOLIC PANEL
BUN: 13 mg/dL (ref 6–23)
CO2: 28 mEq/L (ref 19–32)
Calcium: 9.3 mg/dL (ref 8.4–10.5)
Chloride: 102 mEq/L (ref 96–112)
Creatinine, Ser: 1.03 mg/dL (ref 0.40–1.50)
GFR: 86.63 mL/min (ref >60.00)
Glucose, Bld: 104 mg/dL — ABNORMAL HIGH (ref 70–99)
Potassium: 4.1 mEq/L (ref 3.5–5.1)
Sodium: 137 mEq/L (ref 135–145)

## 2020-02-27 LAB — HEMOGLOBIN A1C: Hgb A1c MFr Bld: 6.3 % (ref 4.6–6.5)

## 2020-02-28 LAB — PSA, TOTAL WITH REFLEX TO PSA, FREE: PSA, Total: 0.3 ng/mL (ref <=4.0)

## 2020-03-02 DIAGNOSIS — R7303 Prediabetes: Secondary | ICD-10-CM | POA: Insufficient documentation

## 2020-03-02 NOTE — Progress Notes (Signed)
Lachandra Dettmann T. Maliq Pilley, MD, [place] at [facility] [place], [address]  Phone: [phone]  FAX: [phone]  Alex Anderson - 49 y.o. male  MRN [id number]  Date of Birth: [birth year]  Date: 03/03/2020  PCP: Owens Loffler, MD  Referral: Owens Loffler, MD  Chief Complaint  Patient presents with  . Annual Exam    This visit occurred during the SARS-CoV-2 public health emergency.  Safety protocols were in place, including screening questions prior to the visit, additional usage of staff PPE, and extensive cleaning of exam room while observing appropriate contact time as indicated for disinfecting solutions.   Patient Care Team: Owens Loffler, MD as PCP - General Subjective:   Alex Anderson is a 49 y.o. pleasant patient who presents with the following:  Preventative Health Maintenance Visit:  Health Maintenance Summary Reviewed and updated, unless pt declines services.  Tobacco History Reviewed. Alcohol: No concerns, no excessive use Exercise Habits: Some activity, rec at least 30 mins 5 times a week - this has dropped down since Covid started STD concerns: no risk or activity to increase risk Drug Use: None  Wt Readings from Last 3 Encounters:  03/03/20 227 lb (103 kg)  02/05/19 221 lb 4 oz (100.4 kg)  10/31/17 225 lb 8 oz (102.3 kg)    Lab Results  Component Value Date   HGBA1C 6.3 02/27/2020      Health Maintenance  Topic Date Due  . Hepatitis C Screening  Never done  . URINE MICROALBUMIN  Never done  . HIV Screening  Never done  . INFLUENZA VACCINE  12/02/2019  . TETANUS/TDAP  07/16/2025  . COVID-19 Vaccine  Completed   Immunization History  Administered Date(s) Administered  . Influenza,inj,Quad PF,6+ Mos 03/11/2014, 02/05/2019  . PFIZER SARS-COV-2 Vaccination 06/04/2019, 06/25/2019, 02/20/2020  . PPD Test 03/01/2014, 08/17/2016  . Tdap 07/17/2015    Patient Active Problem List   Diagnosis Date Noted  . Prediabetes 03/02/2020  . Allergic rhinitis due to pollen 11/06/2010  . HYPERLIPIDEMIA 01/22/2008  . HYPERTENSION 01/22/2008    Past Medical History:  Diagnosis Date  . Allergic rhinitis due to pollen    allergic rhinitis  . Hyperlipidemia   . Hypertension     History reviewed. No pertinent surgical history.  Family History  Adopted: Yes    Past Medical History, Surgical History, Social History, Family History, Problem List, Medications, and Allergies have been reviewed and updated if relevant.  Review of Systems: Pertinent positives are listed above.  Otherwise, a full 14 point review of systems has been done in full and it is negative except where it is noted positive.  Objective:   BP 120/80   Pulse 81   Temp 98.3 F (36.8 C) (Temporal)   Ht 5' 7.25" (1.708 m)   Wt 227 lb (103 kg)   SpO2 98%   BMI 35.29 kg/m  Ideal Body Weight: Weight in (lb) to have BMI = 25: 160.5  Ideal Body Weight: Weight in (lb) to have BMI = 25: 160.5 No exam data present Depression screen [facility] 2/9 03/03/2020 02/05/2019 10/31/2017 11/30/2016  Decreased Interest 0 0 0 0  Down, Depressed, Hopeless 0 0 0 0  PHQ - 2 Score 0 0 0 0     GEN: well developed, well nourished, no acute distress Eyes: conjunctiva and lids normal, PERRLA, EOMI ENT: TM clear, nares clear, oral exam WNL Neck: supple, no lymphadenopathy, no  thyromegaly, no JVD Pulm: clear to auscultation and percussion, respiratory effort normal CV: regular rate and rhythm, S1-S2, no murmur, rub or gallop, no bruits, peripheral pulses normal and symmetric, no cyanosis, clubbing, edema or varicosities GI: soft, non-tender; no hepatosplenomegaly, masses; active bowel sounds all quadrants GU: deferred Lymph: no cervical, axillary or inguinal adenopathy MSK: gait normal, muscle tone and strength WNL, no joint swelling, effusions, discoloration, crepitus  SKIN: clear, good turgor, color  WNL, no rashes, lesions, or ulcerations Neuro: normal mental status, normal strength, sensation, and motion Psych: alert; oriented to person, place and time, normally interactive and not anxious or depressed in appearance.  All labs reviewed with patient. Results for orders placed or performed in visit on 02/27/20  PSA, Total with Reflex to PSA, Free  Result Value Ref Range   PSA, Total 0.3 < OR = 4.0 ng/mL  Hemoglobin A1c  Result Value Ref Range   Hgb A1c MFr Bld 6.3 4.6 - 6.5 %  Basic metabolic panel  Result Value Ref Range   Sodium 137 135 - 145 mEq/L   Potassium 4.1 3.5 - 5.1 mEq/L   Chloride 102 96 - 112 mEq/L   CO2 28 19 - 32 mEq/L   Glucose, Bld 104 (H) 70 - 99 mg/dL   BUN 13 6 - 23 mg/dL   Creatinine, Ser 1.03 0.40 - 1.50 mg/dL   GFR 86.63 >60.00 mL/min   Calcium 9.3 8.4 - 10.5 mg/dL  Hepatic function panel  Result Value Ref Range   Total Bilirubin 0.7 0.2 - 1.2 mg/dL   Bilirubin, Direct 0.1 0.0 - 0.3 mg/dL   Alkaline Phosphatase 64 39 - 117 U/L   AST 35 0 - 37 U/L   ALT 52 0 - 53 U/L   Total Protein 7.4 6.0 - 8.3 g/dL   Albumin 4.2 3.5 - 5.2 g/dL  CBC with Differential/Platelet  Result Value Ref Range   WBC 6.3 4.0 - 10.5 K/uL   RBC 5.23 4.22 - 5.81 Mil/uL   Hemoglobin 15.3 13.0 - 17.0 g/dL   HCT 45.8 39 - 52 %   MCV 87.6 78.0 - 100.0 fl   MCHC 33.4 30.0 - 36.0 g/dL   RDW 13.7 11.5 - 15.5 %   Platelets 177.0 150 - 400 K/uL   Neutrophils Relative % 58.1 43 - 77 %   Lymphocytes Relative 32.2 12 - 46 %   Monocytes Relative 7.4 3 - 12 %   Eosinophils Relative 1.9 0 - 5 %   Basophils Relative 0.4 0 - 3 %   Neutro Abs 3.7 1.4 - 7.7 K/uL   Lymphs Abs 2.0 0.7 - 4.0 K/uL   Monocytes Absolute 0.5 0.1 - 1.0 K/uL   Eosinophils Absolute 0.1 0.0 - 0.7 K/uL   Basophils Absolute 0.0 0.0 - 0.1 K/uL  Lipid panel  Result Value Ref Range   Cholesterol 164 0 - 200 mg/dL   Triglycerides 107.0 0 - 149 mg/dL   HDL 46.70 >39.00 mg/dL   VLDL 21.4 0.0 - 40.0 mg/dL   LDL  Cholesterol 96 0 - 99 mg/dL   Total CHOL/HDL Ratio 4    NonHDL 117.24     Assessment and Plan:     ICD-10-CM   1. Healthcare maintenance  Z00.00    With worsening prediabetes, I would like for him to drop some weight - combo diet and exercise.  Otherwise, Alex Anderson is doing well with stable BP, lipids.  Health Maintenance Exam: The patient's preventative maintenance and recommended  screening tests for an annual wellness exam were reviewed in full today. Brought up to date unless services declined.  Counselled on the importance of diet, exercise, and its role in overall health and mortality. The patient's FH and SH was reviewed, including their home life, tobacco status, and drug and alcohol status.  Follow-up in 1 year for physical exam or additional follow-up below.  Follow-up: No follow-ups on file. Or follow-up in 1 year if not noted.  Meds ordered this encounter  Medications  . atorvastatin (LIPITOR) 40 MG tablet    Sig: Take 1 tablet (40 mg total) by mouth daily.    Dispense:  90 tablet    Refill:  3    Requesting 1 year supply  . amLODipine (NORVASC) 10 MG tablet    Sig: Take 1 tablet (10 mg total) by mouth daily.    Dispense:  90 tablet    Refill:  3    Requesting 1 year supply   Medications Discontinued During This Encounter  Medication Reason  . atorvastatin (LIPITOR) 40 MG tablet Reorder  . amLODipine (NORVASC) 10 MG tablet Reorder   No orders of the defined types were placed in this encounter.   Signed,  Maud Deed. Burdett Pinzon, MD   Allergies as of 03/03/2020      Reactions   Ace Inhibitors    REACTION: cough   Hydrocodone       Medication List       Accurate as of March 03, 2020 12:19 PM. If you have any questions, ask your nurse or doctor.        amLODipine 10 MG tablet Commonly known as: NORVASC Take 1 tablet (10 mg total) by mouth daily.   atorvastatin 40 MG tablet Commonly known as: LIPITOR Take 1 tablet (40 mg total) by mouth daily.     fluticasone 50 MCG/ACT nasal spray Commonly known as: FLONASE Place 2 sprays into both nostrils as needed.

## 2020-03-03 ENCOUNTER — Encounter: Payer: Self-pay | Admitting: Family Medicine

## 2020-03-03 ENCOUNTER — Other Ambulatory Visit: Payer: Self-pay

## 2020-03-03 ENCOUNTER — Encounter: Payer: 59 | Admitting: Family Medicine

## 2020-03-03 ENCOUNTER — Ambulatory Visit (INDEPENDENT_AMBULATORY_CARE_PROVIDER_SITE_OTHER): Payer: 59 | Admitting: Family Medicine

## 2020-03-03 VITALS — BP 120/80 | HR 81 | Temp 98.3°F | Ht 67.25 in | Wt 227.0 lb

## 2020-03-03 DIAGNOSIS — Z1211 Encounter for screening for malignant neoplasm of colon: Secondary | ICD-10-CM | POA: Diagnosis not present

## 2020-03-03 DIAGNOSIS — Z0000 Encounter for general adult medical examination without abnormal findings: Secondary | ICD-10-CM | POA: Diagnosis not present

## 2020-03-03 DIAGNOSIS — Z23 Encounter for immunization: Secondary | ICD-10-CM | POA: Diagnosis not present

## 2020-03-03 MED ORDER — AMLODIPINE BESYLATE 10 MG PO TABS
10.0000 mg | ORAL_TABLET | Freq: Every day | ORAL | 3 refills | Status: DC
Start: 2020-03-03 — End: 2021-03-02

## 2020-03-03 MED ORDER — ATORVASTATIN CALCIUM 40 MG PO TABS
40.0000 mg | ORAL_TABLET | Freq: Every day | ORAL | 3 refills | Status: DC
Start: 2020-03-03 — End: 2021-03-02

## 2020-03-13 ENCOUNTER — Telehealth: Payer: Self-pay

## 2020-03-13 ENCOUNTER — Encounter: Payer: Self-pay | Admitting: *Deleted

## 2020-03-13 NOTE — Telephone Encounter (Signed)
Returned patients call. Informed patient his visit will be a telephone call. Pt verbalized understanding.

## 2020-03-17 ENCOUNTER — Other Ambulatory Visit: Payer: Self-pay

## 2020-03-17 ENCOUNTER — Telehealth (INDEPENDENT_AMBULATORY_CARE_PROVIDER_SITE_OTHER): Payer: Self-pay | Admitting: Gastroenterology

## 2020-03-17 DIAGNOSIS — Z1211 Encounter for screening for malignant neoplasm of colon: Secondary | ICD-10-CM

## 2020-03-17 MED ORDER — NA SULFATE-K SULFATE-MG SULF 17.5-3.13-1.6 GM/177ML PO SOLN: 1.0000 | Freq: Once | ORAL | 0 refills | Status: AC

## 2020-03-17 NOTE — Progress Notes (Signed)
Gastroenterology Pre-Procedure Review  Request Date: Friday 05/09/20 Requesting Physician: Dr. Vicente Males  PATIENT REVIEW QUESTIONS: The patient responded to the following health history questions as indicated:    1. Are you having any GI issues? no 2. Do you have a personal history of Polyps? no 3. Do you have a family history of Colon Cancer or Polyps? unsure of family history.  pt was adopted. 4. Diabetes Mellitus? no 5. Joint replacements in the past 12 months?no 6. Major health problems in the past 3 months?no 7. Any artificial heart valves, MVP, or defibrillator?no    MEDICATIONS & ALLERGIES:    Patient reports the following regarding taking any anticoagulation/antiplatelet therapy:   Plavix, Coumadin, Eliquis, Xarelto, Lovenox, Pradaxa, Brilinta, or Effient? no Aspirin? no  Patient confirms/reports the following medications:  Current Outpatient Medications  Medication Sig Dispense Refill  . amLODipine (NORVASC) 10 MG tablet Take 1 tablet (10 mg total) by mouth daily. 90 tablet 3  . atorvastatin (LIPITOR) 40 MG tablet Take 1 tablet (40 mg total) by mouth daily. 90 tablet 3  . fluticasone (FLONASE) 50 MCG/ACT nasal spray Place 2 sprays into both nostrils as needed. 16 g 5   No current facility-administered medications for this visit.    Patient confirms/reports the following allergies:  Allergies  Allergen Reactions  . Ace Inhibitors     REACTION: cough  . Hydrocodone     No orders of the defined types were placed in this encounter.   AUTHORIZATION INFORMATION Primary Insurance: 1D#: Group #:  Secondary Insurance: 1D#: Group #:  SCHEDULE INFORMATION: Date: Friday 05/09/20 Time: Location:[facility]

## 2020-05-07 ENCOUNTER — Other Ambulatory Visit
Admission: RE | Admit: 2020-05-07 | Discharge: 2020-05-07 | Disposition: A | Discharge status: Home / Self Care | Payer: No Typology Code available for payment source | Source: Ambulatory Visit | Attending: Gastroenterology | Admitting: Gastroenterology

## 2020-05-07 ENCOUNTER — Other Ambulatory Visit: Payer: Self-pay

## 2020-05-07 DIAGNOSIS — Z20822 Contact with and (suspected) exposure to covid-19: Secondary | ICD-10-CM | POA: Diagnosis not present

## 2020-05-07 DIAGNOSIS — Z01812 Encounter for preprocedural laboratory examination: Secondary | ICD-10-CM | POA: Insufficient documentation

## 2020-05-08 ENCOUNTER — Encounter: Payer: Self-pay | Admitting: Gastroenterology

## 2020-05-08 LAB — SARS CORONAVIRUS 2 (TAT 6-24 HRS): SARS Coronavirus 2: NEGATIVE

## 2020-05-09 ENCOUNTER — Encounter
Admission: RE | Disposition: A | Discharge status: Home / Self Care | Payer: Self-pay | Source: Home / Self Care | Attending: Gastroenterology

## 2020-05-09 ENCOUNTER — Other Ambulatory Visit: Payer: Self-pay

## 2020-05-09 ENCOUNTER — Encounter: Payer: Self-pay | Admitting: Gastroenterology

## 2020-05-09 ENCOUNTER — Ambulatory Visit: Payer: No Typology Code available for payment source | Admitting: Certified Registered"

## 2020-05-09 ENCOUNTER — Ambulatory Visit
Admission: RE | Admit: 2020-05-09 | Discharge: 2020-05-09 | Disposition: A | Discharge status: Home / Self Care | Payer: No Typology Code available for payment source | Attending: Gastroenterology | Admitting: Gastroenterology

## 2020-05-09 DIAGNOSIS — Z79899 Other long term (current) drug therapy: Secondary | ICD-10-CM | POA: Diagnosis not present

## 2020-05-09 DIAGNOSIS — Z1211 Encounter for screening for malignant neoplasm of colon: Secondary | ICD-10-CM

## 2020-05-09 DIAGNOSIS — K635 Polyp of colon: Secondary | ICD-10-CM | POA: Diagnosis not present

## 2020-05-09 DIAGNOSIS — D123 Benign neoplasm of transverse colon: Secondary | ICD-10-CM | POA: Diagnosis not present

## 2020-05-09 HISTORY — PX: COLONOSCOPY WITH PROPOFOL: SHX5780

## 2020-05-09 SURGERY — COLONOSCOPY WITH PROPOFOL
Anesthesia: General

## 2020-05-09 MED ORDER — PHENYLEPHRINE HCL (PRESSORS) 10 MG/ML IV SOLN
INTRAVENOUS | Status: DC | PRN
  Administered 2020-05-09: 100 ug via INTRAVENOUS

## 2020-05-09 MED ORDER — PROPOFOL 10 MG/ML IV BOLUS
INTRAVENOUS | Status: DC | PRN
  Administered 2020-05-09 (×2): 20 mg via INTRAVENOUS
  Administered 2020-05-09: 60 mg via INTRAVENOUS
  Administered 2020-05-09: 20 mg via INTRAVENOUS

## 2020-05-09 MED ORDER — SODIUM CHLORIDE 0.9 % IV SOLN: INTRAVENOUS | Status: DC

## 2020-05-09 MED ORDER — LIDOCAINE HCL (CARDIAC) PF 100 MG/5ML IV SOSY
PREFILLED_SYRINGE | INTRAVENOUS | Status: DC | PRN
  Administered 2020-05-09: 100 mg via INTRAVENOUS

## 2020-05-09 MED ORDER — PROPOFOL 500 MG/50ML IV EMUL
INTRAVENOUS | Status: DC | PRN
  Administered 2020-05-09: 185 ug/kg/min via INTRAVENOUS
  Administered 2020-05-09: 165 ug/kg/min via INTRAVENOUS

## 2020-05-09 NOTE — Transfer of Care (Signed)
Immediate Anesthesia Transfer of Care Note  Patient: Alex Anderson  Procedure(s) Performed: COLONOSCOPY WITH PROPOFOL (N/A )  Patient Location: Endoscopy Unit  Anesthesia Type:General  Level of Consciousness: drowsy and patient cooperative  Airway & Oxygen Therapy: Patient Spontanous Breathing and Patient connected to face mask oxygen  Post-op Assessment: Report given to RN and Post -op Vital signs reviewed and stable  Post vital signs: Reviewed and stable  Last Vitals:  Vitals Value Taken Time  BP    Temp    Pulse 76 05/09/20 0858  Resp 12 05/09/20 0857  SpO2 100 % 05/09/20 0858  Vitals shown include unvalidated device data.  Last Pain:  Vitals:   05/09/20 0803  TempSrc: Temporal  PainSc: 0-No pain         Complications: No complications documented.

## 2020-05-09 NOTE — Anesthesia Postprocedure Evaluation (Signed)
Anesthesia Post Note  Patient: Alex Anderson  Procedure(s) Performed: COLONOSCOPY WITH PROPOFOL (N/A )  Patient location during evaluation: Endoscopy Anesthesia Type: General Level of consciousness: awake and alert Pain management: pain level controlled Vital Signs Assessment: post-procedure vital signs reviewed and stable Respiratory status: spontaneous breathing, nonlabored ventilation, respiratory function stable and patient connected to nasal cannula oxygen Cardiovascular status: blood pressure returned to baseline and stable Postop Assessment: no apparent nausea or vomiting Anesthetic complications: no   No complications documented.   Last Vitals:  Vitals:   05/09/20 0906 05/09/20 0916  BP: 108/75 106/73  Pulse: 72 70  Resp: 14 16  Temp:    SpO2: 98% 100%    Last Pain:  Vitals:   05/09/20 0916  TempSrc:   PainSc: 0-No pain                 Martha Clan

## 2020-05-09 NOTE — Op Note (Signed)
[facility] Gastroenterology Patient Name: Alex Anderson Procedure Date: 05/09/2020 8:29 AM MRN: [id number] Account #: [id number] Date of Birth: July 27, 1970 Admit Type: Outpatient Age: 50 Room: [facility] ENDO ROOM 4 Gender: Male Note Status: Finalized Procedure:             Colonoscopy Indications:           Screening for colorectal malignant neoplasm Providers:             Jonathon Bellows MD, MD Referring MD:          Maud Deed. Copland MD, MD (Referring MD) Medicines:             Monitored Anesthesia Care Complications:         No immediate complications. Procedure:             Pre-Anesthesia Assessment:                        - Prior to the procedure, a History and Physical was                         performed, and patient medications, allergies and                         sensitivities were reviewed. The patient's tolerance                         of previous anesthesia was reviewed.                        - The risks and benefits of the procedure and the                         sedation options and risks were discussed with the                         patient. All questions were answered and informed                         consent was obtained.                        - ASA Grade Assessment: II - A patient with mild                         systemic disease.                        After obtaining informed consent, the colonoscope was                         passed under direct vision. Throughout the procedure,                         the patient's blood pressure, pulse, and oxygen                         saturations were monitored continuously. The                         Colonoscope was introduced through the  anus and                         advanced to the the cecum, identified by the                         appendiceal orifice. The colonoscopy was performed                         with ease. The patient tolerated the procedure well.                         The quality  of the bowel preparation was excellent. Findings:      The perianal and digital rectal examinations were normal.      A 3 mm polyp was found in the transverse colon. The polyp was sessile.       The polyp was removed with a jumbo cold forceps. Resection and retrieval       were complete.      A 5 mm polyp was found in the transverse colon. The polyp was sessile.       The polyp was removed with a cold snare. Resection and retrieval were       complete.      The exam was otherwise without abnormality on direct and retroflexion       views. Impression:            - One 3 mm polyp in the transverse colon, removed with                         a jumbo cold forceps. Resected and retrieved.                        - One 5 mm polyp in the transverse colon, removed with                         a cold snare. Resected and retrieved.                        - The examination was otherwise normal on direct and                         retroflexion views. Recommendation:        - Discharge patient to home (with escort).                        - Resume previous diet.                        - Continue present medications.                        - Await pathology results.                        - Repeat colonoscopy for surveillance based on                         pathology results. Procedure Code(s):     --- Professional ---  45385, Colonoscopy, flexible; with removal of                         tumor(s), polyp(s), or other lesion(s) by snare                         technique                        45380, 29, Colonoscopy, flexible; with biopsy, single                         or multiple Diagnosis Code(s):     --- Professional ---                        Z12.11, Encounter for screening for malignant neoplasm                         of colon                        K63.5, Polyp of colon CPT copyright 2019 American Medical Association. All rights reserved. The codes documented in this  report are preliminary and upon coder review may  be revised to meet current compliance requirements. Jonathon Bellows, MD Jonathon Bellows MD, MD 05/09/2020 8:54:07 AM This report has been signed electronically. Number of Addenda: 0 Note Initiated On: 05/09/2020 8:29 AM Scope Withdrawal Time: 0 hours 15 minutes 30 seconds  Total Procedure Duration: 0 hours 18 minutes 37 seconds  Estimated Blood Loss:  Estimated blood loss: none.      [facility]

## 2020-05-09 NOTE — Anesthesia Procedure Notes (Signed)
Procedure Name: General with mask airway Performed by: Fletcher-Harrison, Shauntell Iglesia, CRNA Pre-anesthesia Checklist: Patient identified, Emergency Drugs available, Suction available and Patient being monitored Patient Re-evaluated:Patient Re-evaluated prior to induction Oxygen Delivery Method: Simple face mask Induction Type: IV induction Placement Confirmation: positive ETCO2 and CO2 detector Dental Injury: Teeth and Oropharynx as per pre-operative assessment        

## 2020-05-09 NOTE — Anesthesia Preprocedure Evaluation (Signed)
Anesthesia Evaluation  Patient identified by MRN, date of birth, ID band Patient awake    Reviewed: Allergy & Precautions, H&P , NPO status , Patient's Chart, lab work & pertinent test results, reviewed documented beta blocker date and time   History of Anesthesia Complications Negative for: history of anesthetic complications  Airway Mallampati: I  TM Distance: >3 FB Neck ROM: full    Dental  (+) Dental Advidsory Given, Teeth Intact, Missing   Pulmonary neg pulmonary ROS,    Pulmonary exam normal breath sounds clear to auscultation       Cardiovascular Exercise Tolerance: Good hypertension, (-) angina(-) Past MI and (-) Cardiac Stents Normal cardiovascular exam(-) dysrhythmias (-) Valvular Problems/Murmurs Rhythm:regular Rate:Normal     Neuro/Psych negative neurological ROS  negative psych ROS   GI/Hepatic negative GI ROS, Neg liver ROS,   Endo/Other  negative endocrine ROS  Renal/GU negative Renal ROS  negative genitourinary   Musculoskeletal   Abdominal   Peds  Hematology negative hematology ROS (+)   Anesthesia Other Findings Past Medical History: No date: Allergic rhinitis due to pollen     Comment:  allergic rhinitis No date: Hyperlipidemia No date: Hypertension   Reproductive/Obstetrics negative OB ROS                             Anesthesia Physical Anesthesia Plan  ASA: II  Anesthesia Plan: General   Post-op Pain Management:    Induction: Intravenous  PONV Risk Score and Plan: 2 and TIVA and Propofol infusion  Airway Management Planned: Natural Airway and Nasal Cannula  Additional Equipment:   Intra-op Plan:   Post-operative Plan:   Informed Consent: I have reviewed the patients History and Physical, chart, labs and discussed the procedure including the risks, benefits and alternatives for the proposed anesthesia with the patient or authorized representative who  has indicated his/her understanding and acceptance.     Dental Advisory Given  Plan Discussed with: Anesthesiologist, CRNA and Surgeon  Anesthesia Plan Comments:         Anesthesia Quick Evaluation

## 2020-05-09 NOTE — H&P (Signed)
     Jonathon Bellows, MD [address], [place], [place], [place], [address] [address], [place], [place], [place], [address] Phone: [phone]  Fax: [phone]  Primary Care Physician:  Owens Loffler, MD   Pre-Procedure History & Physical: HPI:  Alex Anderson is a 50 y.o. male is here for an colonoscopy.   Past Medical History:  Diagnosis Date  . Allergic rhinitis due to pollen    allergic rhinitis  . Hyperlipidemia   . Hypertension     Past Surgical History:  Procedure Laterality Date  . WISDOM TOOTH EXTRACTION      Prior to Admission medications   Medication Sig Start Date End Date Taking? Authorizing Provider  fluticasone (FLONASE) 50 MCG/ACT nasal spray Place 2 sprays into both nostrils as needed. 01/09/14  Yes Copland, Frederico Hamman, MD  amLODipine (NORVASC) 10 MG tablet Take 1 tablet (10 mg total) by mouth daily. 03/03/20   Copland, Frederico Hamman, MD  atorvastatin (LIPITOR) 40 MG tablet Take 1 tablet (40 mg total) by mouth daily. 03/03/20   Owens Loffler, MD    Allergies as of 03/17/2020 - Review Complete 03/17/2020  Allergen Reaction Noted  . Ace inhibitors  10/06/2009  . Hydrocodone      Family History  Adopted: Yes    Social History   Socioeconomic History  . Marital status: Married    Spouse name: Not on file  . Number of children: 1  . Years of education: Not on file  . Highest education level: Not on file  Occupational History  . Occupation: PTA    Comment: [employer]  Tobacco Use  . Smoking status: Never Smoker  . Smokeless tobacco: Never Used  Vaping Use  . Vaping Use: Never used  Substance and Sexual Activity  . Alcohol use: No    Alcohol/week: 0.0 standard drinks  . Drug use: No  . Sexual activity: Yes  Other Topics Concern  . Not on file  Social History Narrative  . Not on file   Social Determinants of Health   Financial Resource Strain: Not on file  Food Insecurity: Not on file  Transportation Needs: Not on file  Physical  Activity: Not on file  Stress: Not on file  Social Connections: Not on file  Intimate Partner Violence: Not on file    Review of Systems: See HPI, otherwise negative ROS  Physical Exam: BP (!) 141/99   Pulse 79   Temp (!) 96.5 F (35.8 C) (Temporal)   Resp 20   Ht 5\' 7"  (1.702 m)   Wt 99.8 kg   SpO2 100%   BMI 34.46 kg/m  General:   Alert,  pleasant and cooperative in NAD Head:  Normocephalic and atraumatic. Neck:  Supple; no masses or thyromegaly. Lungs:  Clear throughout to auscultation, normal respiratory effort.    Heart:  +S1, +S2, Regular rate and rhythm, No edema. Abdomen:  Soft, nontender and nondistended. Normal bowel sounds, without guarding, and without rebound.   Neurologic:  Alert and  oriented x4;  grossly normal neurologically.  Impression/Plan: Alex Anderson is here for an colonoscopy to be performed for Screening colonoscopy average risk   Risks, benefits, limitations, and alternatives regarding  colonoscopy have been reviewed with the patient.  Questions have been answered.  All parties agreeable.   Jonathon Bellows, MD  05/09/2020, 8:21 AM

## 2020-05-12 ENCOUNTER — Encounter: Payer: Self-pay | Admitting: Gastroenterology

## 2020-05-12 LAB — SURGICAL PATHOLOGY

## 2020-07-14 ENCOUNTER — Other Ambulatory Visit: Payer: Self-pay

## 2020-07-14 ENCOUNTER — Telehealth (INDEPENDENT_AMBULATORY_CARE_PROVIDER_SITE_OTHER): Payer: No Typology Code available for payment source | Admitting: Family Medicine

## 2020-07-14 ENCOUNTER — Encounter: Payer: Self-pay | Admitting: Family Medicine

## 2020-07-14 DIAGNOSIS — R42 Dizziness and giddiness: Secondary | ICD-10-CM

## 2020-07-14 DIAGNOSIS — J0100 Acute maxillary sinusitis, unspecified: Secondary | ICD-10-CM

## 2020-07-14 MED ORDER — AMOXICILLIN-POT CLAVULANATE 875-125 MG PO TABS: 1.0000 | ORAL_TABLET | Freq: Two times a day (BID) | ORAL | 0 refills | Status: AC

## 2020-07-14 NOTE — Progress Notes (Signed)
Virtual Visit via Video Note  Subjective  CC:  Chief Complaint  Patient presents with  . Cough    Day 9 of productive cough, sinus congestion, ear fullness, and N/V. Denies any recent COVID tests. Did have COVID 6 weeks ago     I connected with Alex Anderson on 07/14/20 at  9:30 AM EDT by a video enabled telemedicine application and verified that I am speaking with the correct person using two identifiers. Location patient: Home Location provider: [facility] [place], Office Persons participating in the virtual visit: Alex Anderson, Leamon Arnt, MD Reymundo Poll CMA Same day acute visit; PCP not available. New pt to me. Chart reviewed.   I discussed the limitations of evaluation and management by telemedicine and the availability of in person appointments. The patient expressed understanding and agreed to proceed. HPI: Alex Anderson is a 50 y.o. male who was contacted today to address the problems listed above in the chief complaint. . Pleasant 50 year old male with hypertension hyperlipidemia presents for almost 2-week history of sinus symptoms including sinus congestion, thick purulent postnasal drainage, an episode of vertigo yesterday that was responsive to meclizine and some coughing.  He describes sinus pain and pressure.  Feels like the sinus infections that he has had in the past although it has been a while since he had one.  He denies significant cough, shortness of breath, fever or GI symptoms.  He is vaccinated.  He did recover from Covid 6 weeks ago.  No recent new exposures.  He is not tested again.   Assessment  1. Acute non-recurrent maxillary sinusitis   2. Vertigo      Plan   Sinus infection with secondary vertigo: Augmentin x10 days, education given.  Mucinex, caution with Sudafed given diagnosis of hypertension.  Vertigo has improved.  Follow-up if needed I discussed the assessment and treatment plan with the patient. The patient was  provided an opportunity to ask questions and all were answered. The patient agreed with the plan and demonstrated an understanding of the instructions.   The patient was advised to call back or seek an in-person evaluation if the symptoms worsen or if the condition fails to improve as anticipated. Follow up: As needed Visit date not found  Meds ordered this encounter  Medications  . amoxicillin-clavulanate (AUGMENTIN) 875-125 MG tablet    Sig: Take 1 tablet by mouth 2 (two) times daily for 10 days.    Dispense:  20 tablet    Refill:  0      I reviewed the patients updated PMH, FH, and SocHx.    Patient Active Problem List   Diagnosis Date Noted  . Prediabetes 03/02/2020  . Allergic rhinitis due to pollen 11/06/2010  . HYPERLIPIDEMIA 01/22/2008  . HYPERTENSION 01/22/2008   Current Meds  Medication Sig  . amLODipine (NORVASC) 10 MG tablet Take 1 tablet (10 mg total) by mouth daily.  Marland Kitchen amoxicillin-clavulanate (AUGMENTIN) 875-125 MG tablet Take 1 tablet by mouth 2 (two) times daily for 10 days.  Marland Kitchen atorvastatin (LIPITOR) 40 MG tablet Take 1 tablet (40 mg total) by mouth daily.  . fluticasone (FLONASE) 50 MCG/ACT nasal spray Place 2 sprays into both nostrils as needed.    Allergies: Patient is allergic to ace inhibitors and hydrocodone. Family History: Patient family history is not on file. He was adopted. Social History:  Patient  reports that he has never smoked. He has never used smokeless tobacco. He reports that  he does not drink alcohol and does not use drugs.  Review of Systems: Constitutional: Negative for fever malaise or anorexia Cardiovascular: negative for chest pain Respiratory: negative for SOB or persistent cough Gastrointestinal: negative for abdominal pain  OBJECTIVE Vitals: There were no vitals taken for this visit. General: no acute distress , A&Ox3, appears well reports afebrile  Leamon Arnt, MD

## 2020-12-09 ENCOUNTER — Telehealth: Payer: Self-pay | Admitting: Family Medicine

## 2020-12-09 NOTE — Telephone Encounter (Signed)
Pt called regarding his colonoscopy he had in January. He received a bill for $1900 and when he reached out to the insurance, they stated they need a letter from his PCP stating the colonoscopy was for preventative measures in order to cover the rest. Letter can be faxed to [phone].

## 2020-12-10 NOTE — Telephone Encounter (Signed)
Can you let Quintan know I am happy to write that letter.  It will probably take me at least a few days.  Screening colonoscopy at 40 is the recommendation and standard now.

## 2020-12-10 NOTE — Telephone Encounter (Signed)
Gerell notified as instructed by telephone.

## 2020-12-17 ENCOUNTER — Encounter: Payer: Self-pay | Admitting: Family Medicine

## 2020-12-17 NOTE — Telephone Encounter (Signed)
This letter has been done.

## 2020-12-17 NOTE — Telephone Encounter (Signed)
Alex Anderson notified by telephone that his letter is ready.  He states he will print it off his MyChart.

## 2021-02-26 ENCOUNTER — Other Ambulatory Visit: Payer: Self-pay | Admitting: Family Medicine

## 2021-02-27 ENCOUNTER — Encounter: Payer: Self-pay | Admitting: Family Medicine

## 2021-02-27 NOTE — Telephone Encounter (Signed)
CALLED PATIENT AND MAILBOX IS FULL. AND SENT MYCHART LETTER

## 2021-02-27 NOTE — Telephone Encounter (Signed)
Patient needs to schedule CPE for anytime after 03/03/21. Thank you

## 2021-03-02 NOTE — Telephone Encounter (Signed)
2nd attempt  Unable to lm to schedule an appt

## 2021-05-19 ENCOUNTER — Other Ambulatory Visit: Payer: Self-pay | Admitting: Family Medicine

## 2021-05-20 ENCOUNTER — Other Ambulatory Visit: Payer: Self-pay | Admitting: Family Medicine

## 2021-05-20 NOTE — Telephone Encounter (Signed)
Please call and schedule CPE with fasting labs prior with Dr. Lorelei Pont.  Please send back to me once scheduled so I can refill his medication to get him to that appointment.

## 2021-05-20 NOTE — Telephone Encounter (Signed)
Called Mr. Fissel and got him scheduled for 3/8 for CPE  and 3/1 for labs

## 2021-07-01 ENCOUNTER — Other Ambulatory Visit (INDEPENDENT_AMBULATORY_CARE_PROVIDER_SITE_OTHER): Payer: BC Managed Care – PPO

## 2021-07-01 ENCOUNTER — Other Ambulatory Visit: Payer: Self-pay

## 2021-07-01 DIAGNOSIS — Z125 Encounter for screening for malignant neoplasm of prostate: Secondary | ICD-10-CM

## 2021-07-01 DIAGNOSIS — Z79899 Other long term (current) drug therapy: Secondary | ICD-10-CM | POA: Diagnosis not present

## 2021-07-01 DIAGNOSIS — E785 Hyperlipidemia, unspecified: Secondary | ICD-10-CM

## 2021-07-01 DIAGNOSIS — E1169 Type 2 diabetes mellitus with other specified complication: Secondary | ICD-10-CM | POA: Diagnosis not present

## 2021-07-01 LAB — CBC WITH DIFFERENTIAL/PLATELET
Basophils Absolute: 0 10*3/uL (ref 0.0–0.1)
Basophils Relative: 0.4 % (ref 0.0–3.0)
Eosinophils Absolute: 0.1 10*3/uL (ref 0.0–0.7)
Eosinophils Relative: 2.1 % (ref 0.0–5.0)
HCT: 44.6 % (ref 39.0–52.0)
Hemoglobin: 14.8 g/dL (ref 13.0–17.0)
Lymphocytes Relative: 36.9 % (ref 12.0–46.0)
Lymphs Abs: 2 10*3/uL (ref 0.7–4.0)
MCHC: 33.1 g/dL (ref 30.0–36.0)
MCV: 89 fl (ref 78.0–100.0)
Monocytes Absolute: 0.5 10*3/uL (ref 0.1–1.0)
Monocytes Relative: 10 % (ref 3.0–12.0)
Neutro Abs: 2.7 10*3/uL (ref 1.4–7.7)
Neutrophils Relative %: 50.6 % (ref 43.0–77.0)
Platelets: 165 10*3/uL (ref 150.0–400.0)
RBC: 5.01 Mil/uL (ref 4.22–5.81)
RDW: 13.6 % (ref 11.5–15.5)
WBC: 5.4 10*3/uL (ref 4.0–10.5)

## 2021-07-01 LAB — HEPATIC FUNCTION PANEL
ALT: 91 U/L — ABNORMAL HIGH (ref 0–53)
AST: 61 U/L — ABNORMAL HIGH (ref 0–37)
Albumin: 4 g/dL (ref 3.5–5.2)
Alkaline Phosphatase: 57 U/L (ref 39–117)
Bilirubin, Direct: 0.2 mg/dL (ref 0.0–0.3)
Total Bilirubin: 0.9 mg/dL (ref 0.2–1.2)
Total Protein: 7.4 g/dL (ref 6.0–8.3)

## 2021-07-01 LAB — LIPID PANEL
Cholesterol: 152 mg/dL (ref 0–200)
HDL: 48.7 mg/dL (ref >39.00)
LDL Cholesterol: 83 mg/dL (ref 0–99)
NonHDL: 103.26
Total CHOL/HDL Ratio: 3
Triglycerides: 102 mg/dL (ref 0.0–149.0)
VLDL: 20.4 mg/dL (ref 0.0–40.0)

## 2021-07-01 LAB — BASIC METABOLIC PANEL
BUN: 16 mg/dL (ref 6–23)
CO2: 29 mEq/L (ref 19–32)
Calcium: 9 mg/dL (ref 8.4–10.5)
Chloride: 102 mEq/L (ref 96–112)
Creatinine, Ser: 1.02 mg/dL (ref 0.40–1.50)
GFR: 86.83 mL/min (ref >60.00)
Glucose, Bld: 99 mg/dL (ref 70–99)
Potassium: 4.2 mEq/L (ref 3.5–5.1)
Sodium: 135 mEq/L (ref 135–145)

## 2021-07-01 LAB — HEMOGLOBIN A1C: Hgb A1c MFr Bld: 6.1 % (ref 4.6–6.5)

## 2021-07-02 LAB — PSA, TOTAL WITH REFLEX TO PSA, FREE: PSA, Total: 0.3 ng/mL (ref <=4.0)

## 2021-07-08 ENCOUNTER — Encounter: Payer: Self-pay | Admitting: Family Medicine

## 2021-07-08 ENCOUNTER — Ambulatory Visit (INDEPENDENT_AMBULATORY_CARE_PROVIDER_SITE_OTHER): Payer: BC Managed Care – PPO | Admitting: Family Medicine

## 2021-07-08 ENCOUNTER — Other Ambulatory Visit: Payer: Self-pay

## 2021-07-08 VITALS — BP 120/80 | HR 87 | Temp 98.9°F | Ht 67.0 in | Wt 231.4 lb

## 2021-07-08 DIAGNOSIS — Z0000 Encounter for general adult medical examination without abnormal findings: Secondary | ICD-10-CM

## 2021-07-08 DIAGNOSIS — R768 Other specified abnormal immunological findings in serum: Secondary | ICD-10-CM

## 2021-07-08 DIAGNOSIS — R7989 Other specified abnormal findings of blood chemistry: Secondary | ICD-10-CM

## 2021-07-08 DIAGNOSIS — E782 Mixed hyperlipidemia: Secondary | ICD-10-CM

## 2021-07-08 DIAGNOSIS — B181 Chronic viral hepatitis B without delta-agent: Secondary | ICD-10-CM

## 2021-07-08 NOTE — Progress Notes (Signed)
Alex Anderson. Alex Costlow, MD, [place] at [facility] [place], [address]  Phone: [phone]   FAX: [phone]  Alex Anderson - 51 y.o. male   MRN [id number]   Date of Birth: 1971-01-28  Date: 07/08/2021   PCP: Alex Loffler, MD   Referral: Alex Loffler, MD  Chief Complaint  Patient presents with   Annual Exam    This visit occurred during the SARS-CoV-2 public health emergency.  Safety protocols were in place, including screening questions prior to the visit, additional usage of staff PPE, and extensive cleaning of exam room while observing appropriate contact time as indicated for disinfecting solutions.   Patient Care Team: Alex Loffler, MD as PCP - General Subjective:   Alex Anderson is a 51 y.o. pleasant patient who presents with the following:  Preventative Health Maintenance Visit:  Health Maintenance Summary Reviewed and updated, unless pt declines services.  Tobacco History Reviewed.  None. Alcohol: No concerns, no excessive use Exercise Habits: Some activity, rec at least 30 mins 5 times a week - weights only STD concerns: no risk or activity to increase risk, happily married for many years Drug Use: None  He is overall doing really well.  Does have 2 children in seventh grade and first grade.  Everything is otherwise going fine at home.  He does have some elevation in his LFTs, and this is the first time that is occurred.  He does recall having a hepatitis B vaccine in the past.  Lab Results  Component Value Date   ALT 91 (H) 07/01/2021   AST 61 (H) 07/01/2021   ALKPHOS 57 07/01/2021   BILITOT 0.9 07/01/2021     No cardio Weights    Wt Readings from Last 3 Encounters:  07/08/21 231 lb 7 oz (105 kg)  05/09/20 220 lb (99.8 kg)  03/03/20 227 lb (103 kg)    AST and ALT  Health Maintenance  Topic Date Due   URINE MICROALBUMIN  Never done   HIV Screening  Never done   Hepatitis C  Screening  Never done   COVID-19 Vaccine (4 - Booster for Sully series) 04/16/2020   INFLUENZA VACCINE  12/01/2020   TETANUS/TDAP  07/16/2025   COLONOSCOPY (Pts 45-76yr Insurance coverage will need to be confirmed)  05/10/2027   HPV VACCINES  Aged Out   Immunization History  Administered Date(s) Administered   Influenza,inj,Quad PF,6+ Mos 03/11/2014, 02/05/2019, 03/03/2020   PFIZER(Purple Top)SARS-COV-2 Vaccination 06/04/2019, 06/25/2019, 02/20/2020   PPD Test 03/01/2014, 08/17/2016   Tdap 07/17/2015   Patient Active Problem List   Diagnosis Date Noted   Prediabetes 03/02/2020   Allergic rhinitis due to pollen 11/06/2010   HYPERLIPIDEMIA 01/22/2008   HYPERTENSION 01/22/2008    Past Medical History:  Diagnosis Date   Allergic rhinitis due to pollen    allergic rhinitis   Hyperlipidemia    Hypertension     Past Surgical History:  Procedure Laterality Date   COLONOSCOPY WITH PROPOFOL N/A 05/09/2020   Procedure: COLONOSCOPY WITH PROPOFOL;  Surgeon: AJonathon Bellows MD;  [facility];  Service: Gastroenterology;  Laterality: N/A;   WISDOM TOOTH EXTRACTION      Family History  Adopted: Yes    Past Medical History, Surgical History, Social History, Family History, Problem List, Medications, and Allergies have been reviewed and updated if relevant.  Review of Systems: Pertinent positives are listed above.  Otherwise, a full 14 point review of systems has been done in  full and it is negative except where it is noted positive.  Objective:   BP 120/80    Pulse 87    Temp 98.9 F (37.2 C) (Oral)    Ht '5\' 7"'$  (1.702 m)    Wt 231 lb 7 oz (105 kg)    SpO2 96%    BMI 36.25 kg/m  Ideal Body Weight: Weight in (lb) to have BMI = 25: 159.3  Ideal Body Weight: Weight in (lb) to have BMI = 25: 159.3 No results found. Depression screen [facility] 2/9 07/08/2021 03/03/2020 02/05/2019 10/31/2017 11/30/2016  Decreased Interest 0 0 0 0 0  Down, Depressed, Hopeless 0 0 0 0 0  PHQ - 2 Score 0 0  0 0 0     GEN: well developed, well nourished, no acute distress Eyes: conjunctiva and lids normal, PERRLA, EOMI ENT: TM clear, nares clear, oral exam WNL Neck: supple, no lymphadenopathy, no thyromegaly, no JVD Pulm: clear to auscultation and percussion, respiratory effort normal CV: regular rate and rhythm, S1-S2, no murmur, rub or gallop, no bruits, peripheral pulses normal and symmetric, no cyanosis, clubbing, edema or varicosities GI: soft, non-tender; no hepatosplenomegaly, masses; active bowel sounds all quadrants GU: deferred Lymph: no cervical, axillary or inguinal adenopathy MSK: gait normal, muscle tone and strength WNL, no joint swelling, effusions, discoloration, crepitus  SKIN: clear, good turgor, color WNL, no rashes, lesions, or ulcerations Neuro: normal mental status, normal strength, sensation, and motion Psych: alert; oriented to person, place and time, normally interactive and not anxious or depressed in appearance.  All labs reviewed with patient. Results for orders placed or performed in visit on 07/01/21  Lipid panel  Result Value Ref Range   Cholesterol 152 0 - 200 mg/dL   Triglycerides 102.0 0.0 - 149.0 mg/dL   HDL 48.70 >39.00 mg/dL   VLDL 20.4 0.0 - 40.0 mg/dL   LDL Cholesterol 83 0 - 99 mg/dL   Total CHOL/HDL Ratio 3    NonHDL 103.26   Hepatic function panel  Result Value Ref Range   Total Bilirubin 0.9 0.2 - 1.2 mg/dL   Bilirubin, Direct 0.2 0.0 - 0.3 mg/dL   Alkaline Phosphatase 57 39 - 117 U/L   AST 61 (H) 0 - 37 U/L   ALT 91 (H) 0 - 53 U/L   Total Protein 7.4 6.0 - 8.3 g/dL   Albumin 4.0 3.5 - 5.2 g/dL  Basic metabolic panel  Result Value Ref Range   Sodium 135 135 - 145 mEq/L   Potassium 4.2 3.5 - 5.1 mEq/L   Chloride 102 96 - 112 mEq/L   CO2 29 19 - 32 mEq/L   Glucose, Bld 99 70 - 99 mg/dL   BUN 16 6 - 23 mg/dL   Creatinine, Ser 1.02 0.40 - 1.50 mg/dL   GFR 86.83 >60.00 mL/min   Calcium 9.0 8.4 - 10.5 mg/dL  CBC with  Differential/Platelet  Result Value Ref Range   WBC 5.4 4.0 - 10.5 K/uL   RBC 5.01 4.22 - 5.81 Mil/uL   Hemoglobin 14.8 13.0 - 17.0 g/dL   HCT 44.6 39.0 - 52.0 %   MCV 89.0 78.0 - 100.0 fl   MCHC 33.1 30.0 - 36.0 g/dL   RDW 13.6 11.5 - 15.5 %   Platelets 165.0 150.0 - 400.0 K/uL   Neutrophils Relative % 50.6 43.0 - 77.0 %   Lymphocytes Relative 36.9 12.0 - 46.0 %   Monocytes Relative 10.0 3.0 - 12.0 %   Eosinophils Relative 2.1  0.0 - 5.0 %   Basophils Relative 0.4 0.0 - 3.0 %   Neutro Abs 2.7 1.4 - 7.7 K/uL   Lymphs Abs 2.0 0.7 - 4.0 K/uL   Monocytes Absolute 0.5 0.1 - 1.0 K/uL   Eosinophils Absolute 0.1 0.0 - 0.7 K/uL   Basophils Absolute 0.0 0.0 - 0.1 K/uL  Hemoglobin A1c  Result Value Ref Range   Hgb A1c MFr Bld 6.1 4.6 - 6.5 %  PSA, Total with Reflex to PSA, Free  Result Value Ref Range   PSA, Total 0.3 < OR = 4.0 ng/mL    Assessment and Plan:     ICD-10-CM   1. Healthcare maintenance  Z00.00     2. Elevated LFTs  R79.89 Hepatitis B core antibody, IgM    Hepatitis B surface antibody,qualitative    Hepatitis B surface antigen    Hepatitis C antibody    Hepatic function panel    3. Mixed hyperlipidemia  E78.2 Lipid panel     LFTs are elevated, and this is a new thing for him.  I am going to have him stop Lipitor, check a hepatitis panel.  If this is normal, I do think we need to follow-up with a liver ultrasound as well.  If these are normal, most likely would be some fatty liver or transient elevation.  He does have some increased fat intake with cooking for a keto diet for his wife.  Side from this, he is really doing well, and encouraged him to do more cardio and work on weight.  Health Maintenance Exam: The patient's preventative maintenance and recommended screening tests for an annual wellness exam were reviewed in full today. Brought up to date unless services declined.  Counselled on the importance of diet, exercise, and its role in overall health and  mortality. The patient's FH and SH was reviewed, including their home life, tobacco status, and drug and alcohol status.  Follow-up in 1 year for physical exam or additional follow-up below.  Follow-up: No follow-ups on file. Or follow-up in 1 year if not noted.  No orders of the defined types were placed in this encounter.  Medications Discontinued During This Encounter  Medication Reason   atorvastatin (LIPITOR) 40 MG tablet    Orders Placed This Encounter  Procedures   Hepatitis B core antibody, IgM   Hepatitis B surface antibody,qualitative   Hepatitis B surface antigen   Hepatitis C antibody   Lipid panel   Hepatic function panel    Signed,  Belisa Eichholz Anderson. Amar Sippel, MD   Allergies as of 07/08/2021       Reactions   Ace Inhibitors    REACTION: cough   Hydrocodone         Medication List        Accurate as of July 08, 2021  3:34 PM. If you have any questions, ask your nurse or doctor.          STOP taking these medications    atorvastatin 40 MG tablet Commonly known as: LIPITOR Stopped by: Alex Loffler, MD       TAKE these medications    amLODipine 10 MG tablet Commonly known as: NORVASC TAKE 1 TABLET BY MOUTH DAILY   fexofenadine 180 MG tablet Commonly known as: ALLEGRA Take 180 mg by mouth daily.   fluticasone 50 MCG/ACT nasal spray Commonly known as: FLONASE Place 2 sprays into both nostrils as needed.

## 2021-07-09 LAB — HEPATITIS B SURFACE ANTIGEN: Hepatitis B Surface Ag: REACTIVE — AB

## 2021-07-09 LAB — HEPATITIS C ANTIBODY
Hepatitis C Ab: NONREACTIVE
SIGNAL TO CUT-OFF: 0.24 (ref <1.00)

## 2021-07-09 LAB — HEPATITIS B SURFACE ANTIBODY,QUALITATIVE: Hep B S Ab: REACTIVE — AB

## 2021-07-09 LAB — HEPATITIS B CORE ANTIBODY, IGM: Hep B C IgM: NONREACTIVE

## 2021-07-17 NOTE — Addendum Note (Signed)
Addended by: Owens Loffler on: 07/17/2021 11:52 AM ? ? Modules accepted: Orders ? ?

## 2021-07-18 ENCOUNTER — Encounter: Payer: Self-pay | Admitting: *Deleted

## 2021-07-20 ENCOUNTER — Encounter: Payer: Self-pay | Admitting: Family Medicine

## 2021-07-20 ENCOUNTER — Telehealth: Payer: Self-pay

## 2021-07-20 NOTE — Telephone Encounter (Signed)
Pt called this Probation officer. Discussed pt + Hep B results had done by PCP on 07/08/21. Educated and counseled on Hep. Recommended retesting in 71month this could be a false positive. Pt stated his MD ordered additional testing [place] and labs. Advised pt to call with any questions on concerns. ?

## 2021-07-21 ENCOUNTER — Telehealth: Payer: Self-pay | Admitting: Family Medicine

## 2021-07-21 NOTE — Addendum Note (Signed)
Addended by: Owens Loffler on: 07/21/2021 09:06 AM ? ? Modules accepted: Orders ? ?

## 2021-07-21 NOTE — Telephone Encounter (Signed)
Terri,  ? ?Can you help Alex Anderson get set up for a non-fasting blood draw - I want it this week? ? ?I would like for all of his labs sent to [facility] including all of his Hepatitis B serologies. ? ?Thank-you very much for your help. ?

## 2021-07-22 ENCOUNTER — Other Ambulatory Visit (INDEPENDENT_AMBULATORY_CARE_PROVIDER_SITE_OTHER): Payer: BC Managed Care – PPO

## 2021-07-22 ENCOUNTER — Other Ambulatory Visit: Payer: Self-pay

## 2021-07-22 DIAGNOSIS — R7989 Other specified abnormal findings of blood chemistry: Secondary | ICD-10-CM | POA: Diagnosis not present

## 2021-07-22 DIAGNOSIS — R768 Other specified abnormal immunological findings in serum: Secondary | ICD-10-CM

## 2021-07-23 LAB — HEPATITIS B CORE ANTIBODY, IGM: Hep B C IgM: NEGATIVE

## 2021-07-23 LAB — HEPATITIS B E ANTIBODY: Hep B E Ab: NEGATIVE

## 2021-07-23 LAB — HEPATIC FUNCTION PANEL
ALT: 84 IU/L — ABNORMAL HIGH (ref 0–44)
AST: 54 IU/L — ABNORMAL HIGH (ref 0–40)
Albumin: 4.2 g/dL (ref 4.0–5.0)
Alkaline Phosphatase: 73 IU/L (ref 44–121)
Bilirubin Total: 0.4 mg/dL (ref 0.0–1.2)
Bilirubin, Direct: 0.12 mg/dL (ref 0.00–0.40)
Total Protein: 7.8 g/dL (ref 6.0–8.5)

## 2021-07-23 LAB — HEPATITIS B SURFACE ANTIBODY,QUALITATIVE: Hep B Surface Ab, Qual: NONREACTIVE

## 2021-07-23 LAB — HEPATITIS B E ANTIGEN: Hep B E Ag: POSITIVE — AB

## 2021-07-23 LAB — HEPATITIS B SURFACE ANTIGEN: Hepatitis B Surface Ag: POSITIVE — AB

## 2021-07-23 LAB — HEPATITIS B CORE AB W/REFLEX: Hep B Core Total Ab: POSITIVE — AB

## 2021-07-24 ENCOUNTER — Encounter: Payer: Self-pay | Admitting: Family Medicine

## 2021-07-24 DIAGNOSIS — B181 Chronic viral hepatitis B without delta-agent: Secondary | ICD-10-CM

## 2021-07-24 HISTORY — DX: Chronic viral hepatitis B without delta-agent: B18.1

## 2021-07-24 NOTE — Addendum Note (Signed)
Addended by: Owens Loffler on: 07/24/2021 02:25 PM ? ? Modules accepted: Orders ? ?

## 2021-07-24 NOTE — Progress Notes (Signed)
We have communicated.  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  I am going to get ID involved. ?

## 2021-07-29 ENCOUNTER — Other Ambulatory Visit: Payer: Self-pay

## 2021-07-29 ENCOUNTER — Ambulatory Visit
Admission: RE | Admit: 2021-07-29 | Discharge: 2021-07-29 | Disposition: A | Discharge status: Home / Self Care | Payer: BC Managed Care – PPO | Source: Ambulatory Visit | Attending: Family Medicine | Admitting: Family Medicine

## 2021-07-29 DIAGNOSIS — R945 Abnormal results of liver function studies: Secondary | ICD-10-CM | POA: Diagnosis not present

## 2021-07-29 DIAGNOSIS — R7989 Other specified abnormal findings of blood chemistry: Secondary | ICD-10-CM | POA: Diagnosis not present

## 2021-08-11 ENCOUNTER — Other Ambulatory Visit: Payer: BC Managed Care – PPO

## 2021-08-12 ENCOUNTER — Other Ambulatory Visit: Payer: BC Managed Care – PPO

## 2021-08-17 ENCOUNTER — Encounter: Payer: Self-pay | Admitting: Family Medicine

## 2021-08-17 ENCOUNTER — Other Ambulatory Visit (INDEPENDENT_AMBULATORY_CARE_PROVIDER_SITE_OTHER): Payer: BC Managed Care – PPO

## 2021-08-17 DIAGNOSIS — R7989 Other specified abnormal findings of blood chemistry: Secondary | ICD-10-CM

## 2021-08-17 DIAGNOSIS — E782 Mixed hyperlipidemia: Secondary | ICD-10-CM

## 2021-08-17 LAB — HEPATIC FUNCTION PANEL
ALT: 62 U/L — ABNORMAL HIGH (ref 0–53)
AST: 48 U/L — ABNORMAL HIGH (ref 0–37)
Albumin: 3.9 g/dL (ref 3.5–5.2)
Alkaline Phosphatase: 55 U/L (ref 39–117)
Bilirubin, Direct: 0.1 mg/dL (ref 0.0–0.3)
Total Bilirubin: 0.7 mg/dL (ref 0.2–1.2)
Total Protein: 7.3 g/dL (ref 6.0–8.3)

## 2021-08-17 LAB — LIPID PANEL
Cholesterol: 220 mg/dL — ABNORMAL HIGH (ref 0–200)
HDL: 46.5 mg/dL (ref >39.00)
LDL Cholesterol: 142 mg/dL — ABNORMAL HIGH (ref 0–99)
NonHDL: 173.19
Total CHOL/HDL Ratio: 5
Triglycerides: 158 mg/dL — ABNORMAL HIGH (ref 0.0–149.0)
VLDL: 31.6 mg/dL (ref 0.0–40.0)

## 2021-08-18 ENCOUNTER — Other Ambulatory Visit
Admission: RE | Admit: 2021-08-18 | Discharge: 2021-08-18 | Disposition: A | Discharge status: Home / Self Care | Payer: BC Managed Care – PPO | Source: Ambulatory Visit | Attending: Infectious Diseases | Admitting: Infectious Diseases

## 2021-08-18 ENCOUNTER — Encounter: Payer: Self-pay | Admitting: Infectious Diseases

## 2021-08-18 ENCOUNTER — Ambulatory Visit: Payer: BC Managed Care – PPO | Attending: Infectious Diseases | Admitting: Infectious Diseases

## 2021-08-18 VITALS — BP 145/96 | HR 106 | Temp 98.0°F | Wt 235.0 lb

## 2021-08-18 DIAGNOSIS — Z79899 Other long term (current) drug therapy: Secondary | ICD-10-CM | POA: Diagnosis not present

## 2021-08-18 DIAGNOSIS — B181 Chronic viral hepatitis B without delta-agent: Secondary | ICD-10-CM | POA: Insufficient documentation

## 2021-08-18 DIAGNOSIS — Z0189 Encounter for other specified special examinations: Secondary | ICD-10-CM | POA: Diagnosis not present

## 2021-08-18 DIAGNOSIS — I10 Essential (primary) hypertension: Secondary | ICD-10-CM | POA: Diagnosis not present

## 2021-08-18 DIAGNOSIS — E785 Hyperlipidemia, unspecified: Secondary | ICD-10-CM | POA: Diagnosis not present

## 2021-08-18 LAB — HEPATITIS A ANTIBODY, TOTAL: hep A Total Ab: NONREACTIVE

## 2021-08-18 LAB — APTT: aPTT: 34 seconds (ref 24–36)

## 2021-08-18 LAB — PROTIME-INR
INR: 1 (ref 0.8–1.2)
Prothrombin Time: 12.9 seconds (ref 11.4–15.2)

## 2021-08-18 LAB — HIV ANTIBODY (ROUTINE TESTING W REFLEX): HIV Screen 4th Generation wRfx: NONREACTIVE

## 2021-08-18 NOTE — Patient Instructions (Signed)
You are here for hep B diagnosis and treatment. Today will do some labs- your wife should get tested and can be vaccinated if negative ?

## 2021-08-18 NOTE — Progress Notes (Signed)
NAME: Alex Anderson  ?DOB: Aug 09, 1970  ?MRN: [id number]  ?Date/Time: 08/18/2021 8:38 AM ? ? ?Subjective:  ? ??pt is referred to me for hepatitis B infection ?Alex Anderson is a 51 y.o. male with a history of HTN,hyperlipidemia. HE had routine examination with this primary care provider and a CMP was done which showedElevated AST ALT of 61 and 91 on 07/01/2021.  Prior to that February 27, 2020 his LFTs were normal.  The statin which she was taking for hyperlipidemia was discontinued Labs were sent for hepatitis profile.  The hepatitis B surface antigen was reactive and E antigen was positive E antibody was negative core antibody positive and patient was asked to see me.  Hepatitis C antibody was nonreactive ?Patient was adopted from [place] when he was 54 months old.  He does not give any history of blood transfusion or IVDA.  He has been in a monogamous relationship with his wife for the past 20 years. 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  He has no jaundice, nausea or vomiting or abdominal pain. ? ?Past Medical History:  ?Diagnosis Date  ? Allergic rhinitis due to pollen   ? allergic rhinitis  ? Hyperlipidemia   ? Hypertension   ?  ?Past Surgical History:  ?Procedure Laterality Date  ? COLONOSCOPY WITH PROPOFOL N/A 05/09/2020  ? Procedure: COLONOSCOPY WITH PROPOFOL;  Surgeon: Jonathon Bellows, MD;  Location: [facility] ENDOSCOPY;  Service: Gastroenterology;  Laterality: N/A;  ? WISDOM TOOTH EXTRACTION    ?  ?Social History  ? ?Socioeconomic History  ? Marital status: Married  ?  Spouse name: Not on file  ? Number of children: 1  ? Years of education: Not on file  ? Highest education level: Not on file  ?Occupational History  ? Occupation: PTA  ?  Comment: [employer]  ?Tobacco Use  ? Smoking status: Never  ? Smokeless tobacco: Never  ?Vaping Use  ? Vaping Use: Never used  ?Substance and Sexual Activity  ? Alcohol use: No  ?  Alcohol/week: 0.0 standard drinks  ?  Drug use: No  ? Sexual activity: Yes  ?Other Topics Concern  ? Not on file  ?Social History Narrative  ? Not on file  ? ?Social Determinants of Health  ? ?Financial Resource Strain: Not on file  ?Food Insecurity: Not on file  ?Transportation Needs: Not on file  ?Physical Activity: Not on file  ?Stress: Not on file  ?Social Connections: Not on file  ?Intimate Partner Violence: Not on file  ?  ?Family History  ?Adopted: Yes  ? ?Allergies  ?Allergen Reactions  ? Ace Inhibitors   ?  REACTION: cough  ? Hydrocodone   ? ?I? ?Current Outpatient Medications  ?Medication Sig Dispense Refill  ? amLODipine (NORVASC) 10 MG tablet TAKE 1 TABLET BY MOUTH DAILY 90 tablet 0  ? fexofenadine (ALLEGRA) 180 MG tablet Take 180 mg by mouth daily.    ? fluticasone (FLONASE) 50 MCG/ACT nasal spray Place 2 sprays into both nostrils as needed. 16 g 5  ? ?No current facility-administered medications for this visit.  ?  ? ?Abtx:  ?Anti-infectives (From admission, onward)  ? ? None  ? ?  ? ? ?REVIEW OF SYSTEMS:  ?Const: negative fever, negative chills, negative weight loss ?Eyes: negative diplopia or visual changes, negative eye pain ?ENT: negative coryza, negative sore throat ?Resp: negative cough, hemoptysis, dyspnea ?Cards: negative for chest pain, palpitations, lower extremity  edema ?GU: negative for frequency, dysuria and hematuria ?GI: Negative for abdominal pain, diarrhea, bleeding, constipation ?Skin: negative for rash and pruritus ?Heme: negative for easy bruising and gum/nose bleeding ?MS: negative for myalgias, arthralgias, back pain and muscle weakness ?Neurolo:negative for headaches, dizziness, vertigo, memory problems  ?Psych: negative for feelings of anxiety, depression  ?Endocrine: negative for thyroid, diabetes ?Allergy/Immunology-as above ?Objective:  ?VITALS:  ?BP (!) 145/96   Pulse (!) 106   Temp 98 ?F (36.7 ?C) (Temporal)   Wt 235 lb (106.6 kg)   BMI 36.81 kg/m?  ?PHYSICAL EXAM:  ?General: Alert, cooperative, no  distress, appears stated age.  ?Head: Normocephalic, without obvious abnormality, atraumatic. ?Eyes: Conjunctivae clear, anicteric sclerae. Pupils are equal ?ENT Nares normal. No drainage or sinus tenderness. ?Lips, mucosa, and tongue normal. No Thrush ?Neck: Supple, symmetrical, no adenopathy, thyroid: non tender ?no carotid bruit and no JVD. ?Back: No CVA tenderness. ?Lungs: Clear to auscultation bilaterally. No Wheezing or Rhonchi. No rales. ?Heart: Regular rate and rhythm, no murmur, rub or gallop. ?Abdomen: Soft, non-tender,not distended. Bowel sounds normal. No masses ?Extremities: atraumatic, no cyanosis. No edema. No clubbing ?Skin: No rashes or lesions. Or bruising ?Lymph: Cervical, supraclavicular normal. ?Neurologic: Grossly non-focal ?Pertinent Labs ?Lab Results ?CBC ?   ?Component Value Date/Time  ? WBC 5.4 07/01/2021 1100  ? RBC 5.01 07/01/2021 1100  ? HGB 14.8 07/01/2021 1100  ? HCT 44.6 07/01/2021 1100  ? PLT 165.0 07/01/2021 1100  ? MCV 89.0 07/01/2021 1100  ? MCHC 33.1 07/01/2021 1100  ? RDW 13.6 07/01/2021 1100  ? LYMPHSABS 2.0 07/01/2021 1100  ? MONOABS 0.5 07/01/2021 1100  ? EOSABS 0.1 07/01/2021 1100  ? BASOSABS 0.0 07/01/2021 1100  ? ? ? ?  Latest Ref Rng & Units 08/17/2021  ?  7:52 AM 07/22/2021  ? 11:43 AM 07/01/2021  ? 11:00 AM  ?CMP  ?Glucose 70 - 99 mg/dL   99    ?BUN 6 - 23 mg/dL   16    ?Creatinine 0.40 - 1.50 mg/dL   1.02    ?Sodium 135 - 145 mEq/L   135    ?Potassium 3.5 - 5.1 mEq/L   4.2    ?Chloride 96 - 112 mEq/L   102    ?CO2 19 - 32 mEq/L   29    ?Calcium 8.4 - 10.5 mg/dL   9.0    ?Total Protein 6.0 - 8.3 g/dL 7.3   7.8   7.4    ?Total Bilirubin 0.2 - 1.2 mg/dL 0.7   0.4   0.9    ?Alkaline Phos 39 - 117 U/L 55   73   57    ?AST 0 - 37 U/L 48   54   61    ?ALT 0 - 53 U/L 62   84   91    ? ? ?IMAGING RESULTS: ?Ultrasound of the right upper quadrant done on 07/29/2021 was unremarkable. ?I have personally reviewed the films ?? ?Impression/Recommendation ?Hepatitis B infection without any  cirrhosis or decompensation.  With surface antigen, E antigen positive ?Patient originally from [place] was adopted when he was 61 months old. ?Does not remember whether he got hepatitis B vaccine ?Today we will do HIV, hepatitis B DNA, coagulation profile and alpha-fetoprotein ?He will need ultrasound elastography of the liver once the above tests are resulted ? ?We will check hepatitis A antibody.  If nonreactive will need vaccination. ? ?Hep C antibody is negative ? ?Hypertension on amlodipine ? ?Hyperlipidemia was on  statin which has been discontinued. ?Had colonoscopy in January 2022.  2 polyps removed-that was normal ?? ?___________________________________________________ ?Discussed with patient, requesting provider ?Note:  This document was prepared using Dragon voice recognition software and may include unintentional dictation errors.  ?

## 2021-08-19 LAB — HBV REAL-TIME PCR, QUANT
HBV AS IU/ML: 575000000 IU/mL
LOG10 HBV AS IU/ML: 8.76 log10 IU/mL

## 2021-08-19 LAB — HEPATITIS B DNA, ULTRAQUANTITATIVE, PCR

## 2021-08-19 LAB — AFP TUMOR MARKER: AFP, Serum, Tumor Marker: 3.3 ng/mL (ref 0.0–6.9)

## 2021-08-20 ENCOUNTER — Other Ambulatory Visit: Payer: Self-pay | Admitting: Infectious Diseases

## 2021-08-20 DIAGNOSIS — B181 Chronic viral hepatitis B without delta-agent: Secondary | ICD-10-CM

## 2021-08-21 ENCOUNTER — Other Ambulatory Visit
Admission: RE | Admit: 2021-08-21 | Discharge: 2021-08-21 | Disposition: A | Discharge status: Home / Self Care | Payer: BC Managed Care – PPO | Source: Ambulatory Visit | Attending: Infectious Diseases | Admitting: Infectious Diseases

## 2021-08-21 DIAGNOSIS — B181 Chronic viral hepatitis B without delta-agent: Secondary | ICD-10-CM | POA: Diagnosis not present

## 2021-08-26 LAB — MISC LABCORP TEST (SEND OUT): Labcorp test code: 820201

## 2021-09-04 ENCOUNTER — Encounter: Payer: Self-pay | Admitting: Infectious Diseases

## 2021-09-08 ENCOUNTER — Other Ambulatory Visit: Payer: Self-pay | Admitting: Infectious Diseases

## 2021-09-08 DIAGNOSIS — B181 Chronic viral hepatitis B without delta-agent: Secondary | ICD-10-CM

## 2021-09-08 NOTE — Progress Notes (Signed)
Spoke to patient- went over all his labs- HE needs treatment for HEPB - will get ultrasound elastography of the liver to look for cirrhosis- follow up with me after that ?

## 2021-09-15 ENCOUNTER — Ambulatory Visit
Admission: RE | Admit: 2021-09-15 | Discharge: 2021-09-15 | Disposition: A | Discharge status: Home / Self Care | Payer: BC Managed Care – PPO | Source: Ambulatory Visit | Attending: Infectious Diseases | Admitting: Infectious Diseases

## 2021-09-15 DIAGNOSIS — B181 Chronic viral hepatitis B without delta-agent: Secondary | ICD-10-CM | POA: Diagnosis not present

## 2021-09-15 DIAGNOSIS — K7689 Other specified diseases of liver: Secondary | ICD-10-CM | POA: Diagnosis not present

## 2021-09-22 ENCOUNTER — Other Ambulatory Visit: Payer: Self-pay | Admitting: Family Medicine

## 2021-09-22 ENCOUNTER — Encounter: Payer: Self-pay | Admitting: Family Medicine

## 2021-09-22 ENCOUNTER — Telehealth: Payer: Self-pay

## 2021-09-22 MED ORDER — AMLODIPINE BESYLATE 10 MG PO TABS: 10.0000 mg | ORAL_TABLET | Freq: Every day | ORAL | 3 refills | Status: DC

## 2021-09-22 MED ORDER — AMLODIPINE BESYLATE 10 MG PO TABS
10.0000 mg | ORAL_TABLET | Freq: Every day | ORAL | 0 refills | Status: DC
Start: 2021-09-22 — End: 2021-10-15

## 2021-09-22 NOTE — Telephone Encounter (Signed)
-----   Message from Tsosie Billing, MD sent at 09/22/2021 10:37 AM EDT ----- Can you schedule a follow up appt within 7 days for starting treatment for hep B? thx ----- Message ----- From: Interface, Rad Results In Sent: 09/15/2021   3:39 PM EDT To: Tsosie Billing, MD

## 2021-09-22 NOTE — Telephone Encounter (Signed)
Patient scheduled with Dr. Delaine Lame on 10/06/21

## 2021-10-06 ENCOUNTER — Encounter: Payer: Self-pay | Admitting: Infectious Diseases

## 2021-10-06 ENCOUNTER — Ambulatory Visit: Payer: BC Managed Care – PPO | Attending: Infectious Diseases | Admitting: Infectious Diseases

## 2021-10-06 VITALS — BP 136/93 | HR 84 | Temp 97.2°F | Wt 235.0 lb

## 2021-10-06 DIAGNOSIS — Z79899 Other long term (current) drug therapy: Secondary | ICD-10-CM | POA: Diagnosis not present

## 2021-10-06 DIAGNOSIS — E785 Hyperlipidemia, unspecified: Secondary | ICD-10-CM | POA: Diagnosis not present

## 2021-10-06 DIAGNOSIS — B181 Chronic viral hepatitis B without delta-agent: Secondary | ICD-10-CM | POA: Diagnosis not present

## 2021-10-06 DIAGNOSIS — I10 Essential (primary) hypertension: Secondary | ICD-10-CM | POA: Diagnosis not present

## 2021-10-06 DIAGNOSIS — B1910 Unspecified viral hepatitis B without hepatic coma: Secondary | ICD-10-CM | POA: Insufficient documentation

## 2021-10-06 NOTE — Progress Notes (Signed)
NAME: Alex Anderson  DOB: [birth year]  MRN: [id number]  Date/Time: 10/06/2021 8:36 AM   Subjective:   Here for follow up to discuss labs that he had for HEPB Following taken from last note Alex Anderson is a 51 y.o. male with a history of HTN,hyperlipidemia.  had routine examination with this primary care provider and a CMP was done which showedElevated AST ALT of 61 and 91 on 07/01/2021.  Prior to that February 27, 2020 his LFTs were normal.  The statin which he was taking for hyperlipidemia ( for 25 yrs)was discontinued Labs were sent for hepatitis profile.  The hepatitis B surface antigen was reactive and E antigen was positive E antibody was negative core antibody positive and patient was asked to see me.  Hepatitis C antibody was nonreactive Patient was adopted from [place] when he was 37 months old.  He does not give any history of blood transfusion or IVDA.  He has been in a monogamous relationship with his wife for the past 20 years. 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  He has no jaundice, nausea or vomiting or abdominal pain. He reports a freak accident in his office when once he drank soft drink in the fridge and it tasted urine and he had to spit it out. During that period there were frequent break outs in  his office and homeless person would be sleeping inside- HE wondered whether it was a prank by the homeless person Pt has also been dpoing a keto diet because his wife was doing it in [place] and that could be another reason for abnormal LFTS His wife has taken 2 doses of HEPb vaccine Past Medical History:  Diagnosis Date   Allergic rhinitis due to pollen    allergic rhinitis   Hyperlipidemia    Hypertension     Past Surgical History:  Procedure Laterality Date   COLONOSCOPY WITH PROPOFOL N/A 05/09/2020   Procedure: COLONOSCOPY WITH PROPOFOL;  Surgeon: Jonathon Bellows, MD;  Location: [facility] ENDOSCOPY;  Service:  Gastroenterology;  Laterality: N/A;   WISDOM TOOTH EXTRACTION      Social History   Socioeconomic History   Marital status: Married    Spouse name: Not on file   Number of children: 1   Years of education: Not on file   Highest education level: Not on file  Occupational History   Occupation: PTA    Comment: [employer]  Tobacco Use   Smoking status: Never   Smokeless tobacco: Never  Vaping Use   Vaping Use: Never used  Substance and Sexual Activity   Alcohol use: No    Alcohol/week: 0.0 standard drinks   Drug use: No   Sexual activity: Yes  Other Topics Concern   Not on file  Social History Narrative   Not on file   Social Determinants of Health   Financial Resource Strain: Not on file  Food Insecurity: Not on file  Transportation Needs: Not on file  Physical Activity: Not on file  Stress: Not on file  Social Connections: Not on file  Intimate Partner Violence: Not on file    Family History  Adopted: Yes   Allergies  Allergen Reactions   Ace Inhibitors     REACTION: cough   Hydrocodone    I? Current Outpatient Medications  Medication Sig Dispense Refill   amLODipine (NORVASC) 10 MG tablet Take 1 tablet (10 mg total) by mouth daily. [place]  tablet 0   fexofenadine (ALLEGRA) 180 MG tablet Take 180 mg by mouth daily.     fluticasone (FLONASE) 50 MCG/ACT nasal spray Place 2 sprays into both nostrils as needed. 16 g 5   No current facility-administered medications for this visit.     Abtx:  Anti-infectives (From admission, onward)    None       REVIEW OF SYSTEMS:  Const: negative fever, negative chills, negative weight loss Eyes: negative diplopia or visual changes, negative eye pain ENT: negative coryza, negative sore throat Resp: negative cough, hemoptysis, dyspnea Cards: negative for chest pain, palpitations, lower extremity edema GU: negative for frequency, dysuria and hematuria GI: Negative for abdominal pain, diarrhea, bleeding,  constipation Skin: negative for rash and pruritus Heme: negative for easy bruising and gum/nose bleeding MS: negative for myalgias, arthralgias, back pain and muscle weakness Neurolo:negative for headaches, dizziness, vertigo, memory problems  Psych: negative for feelings of anxiety, depression  Endocrine: negative for thyroid, diabetes Allergy/Immunology-ACEI and hydrocodone Objective:  VITALS:  BP (!) 136/93   Pulse 84   Temp (!) 97.2 F (36.2 C) (Temporal)   Wt 235 lb (106.6 kg)   BMI 36.81 kg/m  PHYSICAL EXAM:  General: Alert, cooperative, no distress, appears stated age.  Head: Normocephalic, without obvious abnormality, atraumatic. Eyes: Conjunctivae clear, anicteric sclerae. Pupils are equal ENT Nares normal. No drainage or sinus tenderness. Lips, mucosa, and tongue normal. No Thrush Neck: Supple, symmetrical, no adenopathy, thyroid: non tender no carotid bruit and no JVD. Back: No CVA tenderness. Lungs: Clear to auscultation bilaterally. No Wheezing or Rhonchi. No rales. Heart: Regular rate and rhythm, no murmur, rub or gallop. Abdomen: Soft, non-tender,not distended. Bowel sounds normal. No masses Extremities: atraumatic, no cyanosis. No edema. No clubbing Skin: No rashes or lesions. Or bruising Lymph: Cervical, supraclavicular normal. Neurologic: Grossly non-focal Pertinent Labs Lab Results    Latest Ref Rng & Units 07/01/2021   11:00 AM 02/27/2020    8:05 AM 02/02/2019   12:11 PM  CBC  WBC 4.0 - 10.5 K/uL 5.4   6.3   5.9    Hemoglobin 13.0 - 17.0 g/dL 14.8   15.3   14.9    Hematocrit 39.0 - 52.0 % 44.6   45.8   45.3    Platelets 150.0 - 400.0 K/uL 165.0   177.0   183.0         Latest Ref Rng & Units 08/17/2021    7:52 AM 07/22/2021   11:43 AM 07/01/2021   11:00 AM  CMP  Glucose 70 - 99 mg/dL   99    BUN 6 - 23 mg/dL   16    Creatinine 0.40 - 1.50 mg/dL   1.02    Sodium 135 - 145 mEq/L   135    Potassium 3.5 - 5.1 mEq/L   4.2    Chloride 96 - 112 mEq/L    102    CO2 19 - 32 mEq/L   29    Calcium 8.4 - 10.5 mg/dL   9.0    Total Protein 6.0 - 8.3 g/dL 7.3   7.8   7.4    Total Bilirubin 0.2 - 1.2 mg/dL 0.7   0.4   0.9    Alkaline Phos 39 - 117 U/L 55   73   57    AST 0 - 37 U/L 48   54   61    ALT 0 - 53 U/L 62   84   91  Ultrasound of the right upper quadrant done on 07/29/2021 was unremarkable. 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  With surface antigen, E antigen positive, high HBV dna with dcreasing ALT and the last one in April  was 61 is < 2 times ULN ( 35 is ULN) Pt was adopted from [place] when he was 55 months old- unclear whether it is a congenital transmission or a recent infection  We dont know at this moment whether patient has immune tolerant CHB or a recent  infection . If the latter the possibility of self elimination is high No evidence of cirrhosis by elastography- fib score of 1.77 ( N) We have high LFTonly in  March 2023 Will check DNA and LFTS and e antigen at the end of 6 months ( which would be sept 2023) and then decide  Tenofovir alafanamide 25 mg would be the drug of choice when we decide to start treatment   hepatitis A antibody. Neg-  will need vaccination.  Hep C antibody is negative  Hypertension on amlodipine  Hyperlipidemia was on statin which has been discontinued. Had colonoscopy in January 2022.  2 polyps removed-that was normal ? ___________________________________________________ Discussed with patient,in detail Follow up sept Note:  This document was prepared using Dragon voice recognition software and may include unintentional dictation errors.

## 2021-10-06 NOTE — Patient Instructions (Signed)
You are here for follow up- for hepatitis B= we discussed your labs and will check AST, ALT and Viral DNA in sept 2023 and then will decide on treatment. Protective sex with condom barrier Family to be vaccinated for HEPB

## 2021-10-15 ENCOUNTER — Other Ambulatory Visit: Payer: Self-pay | Admitting: Family Medicine

## 2022-01-07 ENCOUNTER — Ambulatory Visit: Payer: BC Managed Care – PPO | Admitting: Infectious Diseases

## 2022-01-21 ENCOUNTER — Ambulatory Visit: Payer: BC Managed Care – PPO | Attending: Infectious Diseases | Admitting: Infectious Diseases

## 2022-01-21 ENCOUNTER — Other Ambulatory Visit
Admission: RE | Admit: 2022-01-21 | Discharge: 2022-01-21 | Disposition: A | Discharge status: Home / Self Care | Payer: BC Managed Care – PPO | Source: Ambulatory Visit | Attending: Infectious Diseases | Admitting: Infectious Diseases

## 2022-01-21 VITALS — BP 142/95 | HR 73 | Temp 98.0°F | Ht 67.5 in | Wt 230.0 lb

## 2022-01-21 DIAGNOSIS — R7401 Elevation of levels of liver transaminase levels: Secondary | ICD-10-CM | POA: Insufficient documentation

## 2022-01-21 DIAGNOSIS — I10 Essential (primary) hypertension: Secondary | ICD-10-CM | POA: Diagnosis not present

## 2022-01-21 DIAGNOSIS — B181 Chronic viral hepatitis B without delta-agent: Secondary | ICD-10-CM | POA: Diagnosis not present

## 2022-01-21 DIAGNOSIS — Z7901 Long term (current) use of anticoagulants: Secondary | ICD-10-CM | POA: Insufficient documentation

## 2022-01-21 DIAGNOSIS — E785 Hyperlipidemia, unspecified: Secondary | ICD-10-CM | POA: Insufficient documentation

## 2022-01-21 LAB — BASIC METABOLIC PANEL
Anion gap: 6 (ref 5–15)
BUN: 13 mg/dL (ref 6–20)
CO2: 26 mmol/L (ref 22–32)
Calcium: 9.3 mg/dL (ref 8.9–10.3)
Chloride: 105 mmol/L (ref 98–111)
Creatinine, Ser: 1 mg/dL (ref 0.61–1.24)
GFR, Estimated: >60 mL/min (ref >60)
Glucose, Bld: 98 mg/dL (ref 70–99)
Potassium: 3.9 mmol/L (ref 3.5–5.1)
Sodium: 137 mmol/L (ref 135–145)

## 2022-01-21 LAB — HEPATIC FUNCTION PANEL
ALT: 60 U/L — ABNORMAL HIGH (ref 0–44)
AST: 51 U/L — ABNORMAL HIGH (ref 15–41)
Albumin: 4.2 g/dL (ref 3.5–5.0)
Alkaline Phosphatase: 55 U/L (ref 38–126)
Bilirubin, Direct: 0.1 mg/dL (ref 0.0–0.2)
Indirect Bilirubin: 0.8 mg/dL (ref 0.3–0.9)
Total Bilirubin: 0.9 mg/dL (ref 0.3–1.2)
Total Protein: 8.2 g/dL — ABNORMAL HIGH (ref 6.5–8.1)

## 2022-01-21 LAB — PHOSPHORUS: Phosphorus: 4.1 mg/dL (ref 2.5–4.6)

## 2022-01-21 NOTE — Addendum Note (Signed)
Addended by: Antonieta Iba C on: 01/21/2022 11:55 AM   Modules accepted: Orders

## 2022-01-21 NOTE — Addendum Note (Signed)
Addended by: Antonieta Iba C on: 01/21/2022 11:56 AM   Modules accepted: Orders

## 2022-01-21 NOTE — Patient Instructions (Signed)
You are here for follow up of HEPB- today will do labs and decide on rx

## 2022-01-21 NOTE — Progress Notes (Signed)
NAME: Alex Anderson  DOB: [birth year]  MRN: [id number]  Date/Time: 01/21/2022 11:33 AM   Subjective:  Here to follow up for HEPB Pt was diagnosed with HEPB nearly 6 months Ago Not started any treatment Today he is here for follow up and get repeat labs He is doing fine No jaundice, nausea, vomiting,   Following taken from last note Alex Anderson is a 51 y.o. male with a history of HTN,hyperlipidemia.  had routine examination with this primary care provider and a CMP was done which showedElevated AST ALT of 61 and 91 on 07/01/2021.  Prior to that February 27, 2020 his LFTs were normal.  The statin which he was taking for hyperlipidemia ( for 25 yrs)was discontinued Labs were sent for hepatitis profile.  The hepatitis B surface antigen was reactive and E antigen was positive E antibody was negative core antibody positive and patient was asked to see me.  Hepatitis C antibody was nonreactive Patient was adopted from [place] when he was 109 months old.  He does not give any history of blood transfusion or IVDA.  He has been in a monogamous relationship with his wife for the past 20 years. 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  He has no jaundice, nausea or vomiting or abdominal pain. He reports a freak accident in his office when once he drank soft drink in the fridge and it tasted urine and he had to spit it out. During that period there were frequent break outs in  his office and homeless person would be sleeping inside- HE wondered whether it was a prank by the homeless person His wife has taken 3 doses of HEPb vaccine Past Medical History:  Diagnosis Date   Allergic rhinitis due to pollen    allergic rhinitis   Hyperlipidemia    Hypertension     Past Surgical History:  Procedure Laterality Date   COLONOSCOPY WITH PROPOFOL N/A 05/09/2020   Procedure: COLONOSCOPY WITH PROPOFOL;  Surgeon: Alex Bellows, MD;  Location: [facility] ENDOSCOPY;   Service: Gastroenterology;  Laterality: N/A;   WISDOM TOOTH EXTRACTION      Social History   Socioeconomic History   Marital status: Married    Spouse name: Not on file   Number of children: 1   Years of education: Not on file   Highest education level: Not on file  Occupational History   Occupation: PTA    Comment: [employer]  Tobacco Use   Smoking status: Never   Smokeless tobacco: Never  Vaping Use   Vaping Use: Never used  Substance and Sexual Activity   Alcohol use: No    Alcohol/week: 0.0 standard drinks of alcohol   Drug use: No   Sexual activity: Yes  Other Topics Concern   Not on file  Social History Narrative   Not on file   Social Determinants of Health   Financial Resource Strain: Not on file  Food Insecurity: Not on file  Transportation Needs: Not on file  Physical Activity: Not on file  Stress: Not on file  Social Connections: Not on file  Intimate Partner Violence: Not on file    Family History  Adopted: Yes   Allergies  Allergen Reactions   Ace Inhibitors     REACTION: cough   Hydrocodone    I? Current Outpatient Medications  Medication Sig Dispense Refill   amLODipine (NORVASC) 10 MG tablet Take 1 tablet by mouth once  daily 90 tablet 3   fexofenadine (ALLEGRA) 180 MG tablet Take 180 mg by mouth daily.     fluticasone (FLONASE) 50 MCG/ACT nasal spray Place 2 sprays into both nostrils as needed. 16 g 5   No current facility-administered medications for this visit.     Abtx:  Anti-infectives (From admission, onward)    None       REVIEW OF SYSTEMS:  Const: negative fever, negative chills, negative weight loss Eyes: negative diplopia or visual changes, negative eye pain ENT: negative coryza, negative sore throat Resp: negative cough, hemoptysis, dyspnea Cards: negative for chest pain, palpitations, lower extremity edema GU: negative for frequency, dysuria and hematuria GI: Negative for abdominal pain, diarrhea, bleeding,  constipation Skin: negative for rash and pruritus Heme: negative for easy bruising and gum/nose bleeding MS: negative for myalgias, arthralgias, back pain and muscle weakness Neurolo:negative for headaches, dizziness, vertigo, memory problems  Psych: negative for feelings of anxiety, depression  Endocrine: negative for thyroid, diabetes Allergy/Immunology-ACEI and hydrocodone Objective:  VITALS:  BP (!) 142/95   Pulse 73   Temp 98 F (36.7 C) (Temporal)   Ht 5' 7.5" (1.715 m)   Wt 230 lb (104.3 kg)   BMI 35.49 kg/m  PHYSICAL EXAM:  General: Alert, cooperative, no distress, appears stated age.  Head: Normocephalic, without obvious abnormality, atraumatic. Eyes: Conjunctivae clear, anicteric sclerae. Pupils are equal ENT Nares normal. No drainage or sinus tenderness. Lips, mucosa, and tongue normal. No Thrush Neck: Supple, symmetrical, no adenopathy, thyroid: non tender no carotid bruit and no JVD. Back: No CVA tenderness. Lungs: Clear to auscultation bilaterally. No Wheezing or Rhonchi. No rales. Heart: Regular rate and rhythm, no murmur, rub or gallop. Abdomen: Soft, non-tender,not distended. Bowel sounds normal. No masses Extremities: atraumatic, no cyanosis. No edema. No clubbing Skin: No rashes or lesions. Or bruising Lymph: Cervical, supraclavicular normal. Neurologic: Grossly non-focal Pertinent Labs   Ultrasound of the right upper quadrant done on 07/29/2021 was unremarkable. 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   surface antigen, E antigen positive, high HBV dna with mildly elevated ALT/AST Pt was adopted from [place] when he was 71 months old- unclear whether it is a congenital  transmission or a recent infection  We dont know at this moment whether patient has immune tolerant CHB or a recent  infection . If the latter the possibility of self elimination is high No evidence of cirrhosis by elastography- fib score of 1.77 ( N) We have high LFTonly in  March 2023 Will check DNA and LFTS and e antigen today  and then decide on treatment Tenofovir alafanamide 25 mg would be the drug of choice when we decide to start treatment   hepatitis A antibody. Neg-  will need vaccination.  Hep C antibody is negative  Hypertension on amlodipine  Hyperlipidemia was on statin which has been discontinued. Had colonoscopy in January 2022.  2 polyps removed-that was normal ? ___________________________________________________ Discussed with patient,in detail Follow up after lab results Note:  This document was prepared using Dragon voice recognition software and may include unintentional dictation errors.

## 2022-01-22 ENCOUNTER — Encounter: Payer: Self-pay | Admitting: Emergency Medicine

## 2022-01-22 ENCOUNTER — Emergency Department: Payer: BC Managed Care – PPO

## 2022-01-22 ENCOUNTER — Emergency Department
Admission: EM | Admit: 2022-01-22 | Discharge: 2022-01-22 | Disposition: A | Discharge status: Home / Self Care | Payer: BC Managed Care – PPO | Attending: Emergency Medicine | Admitting: Emergency Medicine

## 2022-01-22 ENCOUNTER — Other Ambulatory Visit: Payer: Self-pay

## 2022-01-22 DIAGNOSIS — S4991XA Unspecified injury of right shoulder and upper arm, initial encounter: Secondary | ICD-10-CM | POA: Diagnosis not present

## 2022-01-22 DIAGNOSIS — Y92410 Unspecified street and highway as the place of occurrence of the external cause: Secondary | ICD-10-CM | POA: Diagnosis not present

## 2022-01-22 DIAGNOSIS — S42021A Displaced fracture of shaft of right clavicle, initial encounter for closed fracture: Secondary | ICD-10-CM | POA: Insufficient documentation

## 2022-01-22 DIAGNOSIS — S2241XA Multiple fractures of ribs, right side, initial encounter for closed fracture: Secondary | ICD-10-CM | POA: Diagnosis not present

## 2022-01-22 LAB — HEPATITIS B DNA, ULTRAQUANTITATIVE, PCR

## 2022-01-22 LAB — HBV REAL-TIME PCR, QUANT
HBV AS IU/ML: 610000000 IU/mL
LOG10 HBV AS IU/ML: 8.785 log10 IU/mL

## 2022-01-22 LAB — HEPATITIS B E ANTIGEN: Hep B E Ag: POSITIVE — AB

## 2022-01-22 MED ORDER — OXYCODONE-ACETAMINOPHEN 5-325 MG PO TABS
1.0000 | ORAL_TABLET | Freq: Once | ORAL | Status: AC
  Administered 2022-01-22: 1 via ORAL
  Filled 2022-01-22: qty 1

## 2022-01-22 MED ORDER — OXYCODONE-ACETAMINOPHEN 5-325 MG PO TABS: 1.0000 | ORAL_TABLET | ORAL | 0 refills | Status: DC | PRN
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
## 2022-01-22 NOTE — Discharge Instructions (Addendum)
Wear the sling.  You can put ice wrapped in a towel on the area as well.  Do not fall asleep with ice on there as you can get frostbite.  Dr. Mack Guise the orthopedic surgeon will follow you up in the office.  Give his office a call and tell them that you were seen in the emergency room and he said he did see you in the office in about a week.  Please return for any increasing pain or fever or shortness of breath, numbness or weakness or any other problems.  Take the Percocet 1 pill 4 times a day as needed for pain.  You can take up to 4 times a day if needed or just take Motrin 4 of the over-the-counter pills 3 times a day for 3 to 4 days.  Have something on your stomach when you do that.  After about 4 days try to cut back to 3 3 times a day for another 4 days and then back to 2 pills 4 times a day.  I would not take even the 2 4 times a day for more than an additional 4 days.  I would make a total of 12 days of Motrin.
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
## 2022-01-25 ENCOUNTER — Other Ambulatory Visit: Payer: Self-pay | Admitting: Infectious Diseases

## 2022-01-25 DIAGNOSIS — B181 Chronic viral hepatitis B without delta-agent: Secondary | ICD-10-CM

## 2022-01-25 NOTE — Progress Notes (Signed)
Pt referred to GI/hepatology Dr.Kiran Vicente Males for Hepatitis B with high VL, persistent E antigen. Treatment naive. Informed patient of the recent blood work and referral

## 2022-01-27 LAB — MISC LABCORP TEST (SEND OUT): Labcorp test code: 820201

## 2022-01-28 ENCOUNTER — Telehealth: Payer: Self-pay

## 2022-01-28 NOTE — Telephone Encounter (Signed)
PC to pt to discuss most recent Hep B test results from 01/21/22. Discussed with pt with 2 HBsAg 50mhs apart considered chronic confirmed. Pt was see by GI/hepatology on 9/21 and being followed. Pt did not have any questions or concerns. Control Measures were given to pt on initial interview via phone with pt and copy mailed.

## 2022-01-29 DIAGNOSIS — S42001A Fracture of unspecified part of right clavicle, initial encounter for closed fracture: Secondary | ICD-10-CM | POA: Diagnosis not present

## 2022-02-12 DIAGNOSIS — S42001A Fracture of unspecified part of right clavicle, initial encounter for closed fracture: Secondary | ICD-10-CM | POA: Diagnosis not present

## 2022-03-10 DIAGNOSIS — S42001A Fracture of unspecified part of right clavicle, initial encounter for closed fracture: Secondary | ICD-10-CM | POA: Diagnosis not present

## 2022-03-24 DIAGNOSIS — S42001A Fracture of unspecified part of right clavicle, initial encounter for closed fracture: Secondary | ICD-10-CM | POA: Diagnosis not present

## 2022-05-04 ENCOUNTER — Ambulatory Visit: Payer: BC Managed Care – PPO | Admitting: Gastroenterology

## 2022-07-19 ENCOUNTER — Ambulatory Visit: Payer: BC Managed Care – PPO | Admitting: Family

## 2022-07-19 ENCOUNTER — Encounter: Payer: Self-pay | Admitting: Family

## 2022-07-19 VITALS — BP 130/88 | HR 74 | Temp 97.7°F | Ht 67.5 in | Wt 228.6 lb

## 2022-07-19 DIAGNOSIS — R197 Diarrhea, unspecified: Secondary | ICD-10-CM | POA: Diagnosis not present

## 2022-07-19 DIAGNOSIS — J020 Streptococcal pharyngitis: Secondary | ICD-10-CM | POA: Diagnosis not present

## 2022-07-19 DIAGNOSIS — K649 Unspecified hemorrhoids: Secondary | ICD-10-CM

## 2022-07-19 LAB — POCT INFLUENZA A/B
Influenza A, POC: NEGATIVE
Influenza B, POC: NEGATIVE

## 2022-07-19 LAB — POCT RAPID STREP A (OFFICE): Rapid Strep A Screen: POSITIVE — AB

## 2022-07-19 MED ORDER — HYDROCORTISONE (PERIANAL) 2.5 % EX CREA: 1.0000 | TOPICAL_CREAM | Freq: Two times a day (BID) | CUTANEOUS | 0 refills | Status: DC

## 2022-07-19 MED ORDER — AMOXICILLIN 500 MG PO CAPS: 500.0000 mg | ORAL_CAPSULE | Freq: Two times a day (BID) | ORAL | 0 refills | Status: AC

## 2022-07-19 NOTE — Progress Notes (Signed)
Established Patient Office Visit  Subjective:   Patient ID: Alex Anderson, male    DOB: 03-07-1971  Age: 52 y.o. MRN: [id number]  CC:  Chief Complaint  Patient presents with   Diarrhea    Bloody stools.    HPI: Alex Anderson is a 52 y.o. male presenting on 07/19/2022 for Diarrhea (Bloody stools.)   Diarrhea     Five days ago started with chills and then that night had diarrhea all night which lasted for about three days of continuous diarrhea. Saturday afternoon noticed some blood in his stool, he thinks hemorrhoid vs fissure. Slowed down a bit on Sunday. This am had semi solid stool and his stomach is a little less queasy.   He goes to physical therapy and doesn't want to spread the virus.  He denies known fever. He was dry heaving thursday and Friday of last week.  This morning when he wiped was bright red blood, using tucks which is helping.     ROS: Negative unless specifically indicated above in HPI.   Relevant past medical history reviewed and updated as indicated.   Allergies and medications reviewed and updated.   Current Outpatient Medications:    amLODipine (NORVASC) 10 MG tablet, Take 1 tablet by mouth once daily, Disp: 90 tablet, Rfl: 3   amoxicillin (AMOXIL) 500 MG capsule, Take 1 capsule (500 mg total) by mouth 2 (two) times daily for 10 days., Disp: 20 capsule, Rfl: 0   fexofenadine (ALLEGRA) 180 MG tablet, Take 180 mg by mouth daily., Disp: , Rfl:    fluticasone (FLONASE) 50 MCG/ACT nasal spray, Place 2 sprays into both nostrils as needed., Disp: 16 g, Rfl: 5   hydrocortisone (ANUSOL-HC) 2.5 % rectal cream, Place 1 Application rectally 2 (two) times daily., Disp: 30 g, Rfl: 0  Allergies  Allergen Reactions   Shrimp Extract Hives   Ace Inhibitors     REACTION: cough   Hydrocodone     Objective:   BP 130/88   Pulse 74   Temp 97.7 F (36.5 C) (Temporal)   Ht 5' 7.5" (1.715 m)   Wt 228 lb 9.6 oz (103.7 kg)   SpO2 99%   BMI 35.28 kg/m     Physical Exam Vitals reviewed.  Constitutional:      General: He is not in acute distress.    Appearance: Normal appearance. He is obese. He is not ill-appearing, toxic-appearing or diaphoretic.  HENT:     Head: Normocephalic.     Right Ear: Tympanic membrane normal.     Left Ear: Tympanic membrane normal.     Nose: Nose normal.     Mouth/Throat:     Mouth: Mucous membranes are moist.     Pharynx: Posterior oropharyngeal erythema present.  Eyes:     Pupils: Pupils are equal, round, and reactive to light.  Cardiovascular:     Rate and Rhythm: Normal rate and regular rhythm.  Pulmonary:     Effort: Pulmonary effort is normal.     Breath sounds: Normal breath sounds. No wheezing.  Abdominal:     General: Bowel sounds are increased.     Tenderness: There is abdominal tenderness in the right upper quadrant.     Comments: Mild ruq abd tenderness and epigastric  Bowels slightly increased llq   Musculoskeletal:        General: Normal range of motion.     Cervical back: Normal range of motion.  Lymphadenopathy:     Cervical:  Right cervical: No superficial cervical adenopathy.    Left cervical: No superficial cervical adenopathy.  Neurological:     General: No focal deficit present.     Mental Status: He is alert and oriented to person, place, and time. Mental status is at baseline.  Psychiatric:        Mood and Affect: Mood normal.        Behavior: Behavior normal.        Thought Content: Thought content normal.        Judgment: Judgment normal.     Assessment & Plan:  Diarrhea, unspecified type -     POCT rapid strep A -     POCT Influenza A/B  Hemorrhoids, unspecified hemorrhoid type Assessment & Plan: Anusol cream to use prn   Orders: -     Hydrocortisone (Perianal); Place 1 Application rectally 2 (two) times daily.  Dispense: 30 g; Refill: 0  Strep pharyngitis Assessment & Plan: Strep tested positive in office.  rx for amox 500 mg po bid x 10 days.   Ibuprofen/tyelnol prn sore throat/fever Pt told to F/u if no improvement in the next 2-3 days. Also suspected GI virus  Advised pt bland gallbladder friendly diet.  Fluids as tolerated.  Orders: -     Amoxicillin; Take 1 capsule (500 mg total) by mouth 2 (two) times daily for 10 days.  Dispense: 20 capsule; Refill: 0     Follow up plan: Return for f/u with primary care provider if no improvement.  Eugenia Pancoast, FNP

## 2022-07-19 NOTE — Assessment & Plan Note (Signed)
Strep tested positive in office.  rx for amox 500 mg po bid x 10 days.  Ibuprofen/tyelnol prn sore throat/fever Pt told to F/u if no improvement in the next 2-3 days. Also suspected GI virus  Advised pt bland gallbladder friendly diet.  Fluids as tolerated.

## 2022-07-19 NOTE — Assessment & Plan Note (Signed)
Anusol cream to use prn

## 2022-07-19 NOTE — Patient Instructions (Addendum)
You were found to be strep positive,  Take antibiotics that have been sent to the pharmacy.  Change your toothbrush after 24 hours on the antibiotics.  Gargle with warm salt water as needed for sore throat.   Due to recent changes in healthcare laws, you may see results of your imaging and/or laboratory studies on MyChart before I have had a chance to review them.  I understand that in some cases there may be results that are confusing or concerning to you. Please understand that not all results are received at the same time and often I may need to interpret multiple results in order to provide you with the best plan of care or course of treatment. Therefore, I ask that you please give me 2 business days to thoroughly review all your results before contacting my office for clarification. Should we see a critical lab result, you will be contacted sooner.   It was a pleasure seeing you today! Please do not hesitate to reach out with any questions and or concerns.  Regards,   Kamdin Follett FNP-C    

## 2022-07-21 ENCOUNTER — Ambulatory Visit: Payer: BC Managed Care – PPO | Admitting: Family

## 2022-07-21 ENCOUNTER — Ambulatory Visit: Payer: BC Managed Care – PPO | Admitting: Family Medicine

## 2022-08-10 ENCOUNTER — Ambulatory Visit: Payer: BC Managed Care – PPO | Admitting: Gastroenterology

## 2022-08-10 ENCOUNTER — Other Ambulatory Visit: Payer: Self-pay

## 2022-08-10 ENCOUNTER — Encounter: Payer: Self-pay | Admitting: Gastroenterology

## 2022-08-10 VITALS — BP 138/92 | HR 86 | Temp 98.3°F | Ht 67.5 in | Wt 230.4 lb

## 2022-08-10 DIAGNOSIS — B181 Chronic viral hepatitis B without delta-agent: Secondary | ICD-10-CM

## 2022-08-10 NOTE — Progress Notes (Signed)
Wyline Mood MD, MRCP(U.K) [address]  [address]  [place], [place] [address]  Main: [phone]  Fax: [phone]   Gastroenterology Consultation  Referring Provider:     Hannah Beat, MD Primary Care Physician:  Hannah Beat, MD Primary Gastroenterologist:  Dr. Wyline Mood  Reason for Consultation: Chronic hepatitis B        HPI:   Alex Anderson is a 52 y.o. y/o male referred for consultation & management  by Dr. Hannah Beat, MD.    He is here today to see me for chronic hepatitis B.  He was adopted and originally from [place].  He is not connected to his biological parents.  He has not known how long he has had hepatitis B.  Denies any illegal drug use, blood transfusion, [employer], tattoos.  He is married with a wife not active sexually for 1-1/2 years because he is getting her vaccinated and he has had his children tested and they are negative for hepatitis B as well.  Denies any excess alcohol consumption.  He was on Lipitor which has been held.   01/21/2022: Hepatitis B virus 6.10 million, creatinine 1.0, hepatitis B E antigen + 01/21/2022 AST 51 ALT 60  09/15/2021 median K PA of 7 during elastography.   Past Medical History:  Diagnosis Date   Allergic rhinitis due to pollen    allergic rhinitis   Hyperlipidemia    Hypertension     Past Surgical History:  Procedure Laterality Date   COLONOSCOPY WITH PROPOFOL N/A 05/09/2020   Procedure: COLONOSCOPY WITH PROPOFOL;  Surgeon: Wyline Mood, MD;  Location: [facility] ENDOSCOPY;  Service: Gastroenterology;  Laterality: N/A;   WISDOM TOOTH EXTRACTION      Prior to Admission medications   Medication Sig Start Date End Date Taking? Authorizing Provider  amLODipine (NORVASC) 10 MG tablet Take 1 tablet by mouth once daily 10/15/21   Copland, Karleen Hampshire, MD  fexofenadine (ALLEGRA) 180 MG tablet Take 180 mg by mouth daily.    [provider]  fluticasone (FLONASE) 50 MCG/ACT nasal spray Place 2 sprays into  both nostrils as needed. 01/09/14   Copland, Karleen Hampshire, MD  hydrocortisone (ANUSOL-HC) 2.5 % rectal cream Place 1 Application rectally 2 (two) times daily. 07/19/22   Mort Sawyers, FNP    Family History  Adopted: Yes     Social History   Tobacco Use   Smoking status: Never   Smokeless tobacco: Never  Vaping Use   Vaping Use: Never used  Substance Use Topics   Alcohol use: No    Alcohol/week: 0.0 standard drinks of alcohol   Drug use: No    Allergies as of 08/10/2022 - Review Complete 07/19/2022  Allergen Reaction Noted   Shrimp extract Hives 07/19/2022   Ace inhibitors  10/06/2009   Hydrocodone      Review of Systems:    All systems reviewed and negative except where noted in HPI.   Physical Exam:  BP (!) 133/90   Pulse 86   Temp 98.3 F (36.8 C) (Oral)   Ht 5' 7.5" (1.715 m)   Wt 230 lb 6.4 oz (104.5 kg)   BMI 35.55 kg/m  No LMP for male patient. Psych:  Alert and cooperative. Normal mood and affect. General:   Alert,  Well-developed, well-nourished, pleasant and cooperative in NAD Head:  Normocephalic and atraumatic. Eyes:  Sclera clear, no icterus.   Conjunctiva pink. Ears:  Normal auditory acuity.  Neurologic:  Alert and oriented x3;  grossly normal neurologically.  Psych:  Alert and cooperative. Normal mood and affect.  Imaging Studies: No results found.  Assessment and Plan:   [name] is a 52 y.o. y/o male has been referred for hepatitis B review of his labs suggest abnormal LFTs which is very likely related to hepatitis B but other causes need to be ruled out.  He also has a high viral load and depending on his other lab work we will decide about commencing on treatment for hepatitis B.  Explained importance of treating hepatitis B as untreated hepatitis B can increase the risk of liver cancer  Plan 1.  Check hepatitis delta antigen, recheck hepatitis B viral load, LFTs, hepatitis C antibody, liver elastography, CMP.  Based on results we will start him  on treatment for hepatitis B. 2. Full autoimmune work up for abnormal lfts 3.  Discussed about having his sexual contacts vaccinated use barrier protection during sexual intercourse if partner is not vaccinated or immune.  Not to share toothbrushes or razors or injection equipment not to donate blood organs of sperm.   Follow up in 4 weeks   Dr Wyline Mood MD,MRCP(U.K)

## 2022-08-10 NOTE — Addendum Note (Signed)
Addended by: Adela Ports on: 08/10/2022 02:03 PM   Modules accepted: Orders

## 2022-08-11 NOTE — Progress Notes (Signed)
Needs hep a vaccine

## 2022-08-12 ENCOUNTER — Telehealth: Payer: Self-pay

## 2022-08-12 NOTE — Telephone Encounter (Signed)
-----   Message from Wyline Mood, MD sent at 08/11/2022 11:12 AM EDT ----- Needs hep a vaccine

## 2022-08-12 NOTE — Telephone Encounter (Signed)
Called patient to let him know that he is needing a Hepatitis A vaccine but had to leave him a voicemail.

## 2022-08-19 LAB — IMMUNOGLOBULINS A/E/G/M, SERUM
IgA/Immunoglobulin A, Serum: 437 mg/dL — ABNORMAL HIGH (ref 90–386)
IgE (Immunoglobulin E), Serum: 127 IU/mL (ref 6–495)
IgG (Immunoglobin G), Serum: 1848 mg/dL — ABNORMAL HIGH (ref 603–1613)
IgM (Immunoglobulin M), Srm: 173 mg/dL — ABNORMAL HIGH (ref 20–172)

## 2022-08-19 LAB — CK: Total CK: 238 U/L (ref 49–439)

## 2022-08-19 LAB — COMPREHENSIVE METABOLIC PANEL
ALT: 53 IU/L — ABNORMAL HIGH (ref 0–44)
AST: 45 IU/L — ABNORMAL HIGH (ref 0–40)
Albumin/Globulin Ratio: 1.3 (ref 1.2–2.2)
Albumin: 4.2 g/dL (ref 4.1–5.1)
Alkaline Phosphatase: 77 IU/L (ref 44–121)
BUN/Creatinine Ratio: 14 (ref 9–20)
BUN: 16 mg/dL (ref 6–24)
Bilirubin Total: 0.4 mg/dL (ref 0.0–1.2)
CO2: 24 mmol/L (ref 20–29)
Calcium: 9.3 mg/dL (ref 8.7–10.2)
Chloride: 100 mmol/L (ref 96–106)
Creatinine, Ser: 1.12 mg/dL (ref 0.76–1.27)
Globulin, Total: 3.2 g/dL (ref 1.5–4.5)
Glucose: 107 mg/dL — ABNORMAL HIGH (ref 70–99)
Potassium: 4.1 mmol/L (ref 3.5–5.2)
Sodium: 138 mmol/L (ref 134–144)
Total Protein: 7.4 g/dL (ref 6.0–8.5)
eGFR: 81 mL/min/{1.73_m2} (ref >59)

## 2022-08-19 LAB — CBC WITH DIFFERENTIAL/PLATELET
Basophils Absolute: 0 10*3/uL (ref 0.0–0.2)
Basos: 1 %
EOS (ABSOLUTE): 0.2 10*3/uL (ref 0.0–0.4)
Eos: 3 %
Hematocrit: 47 % (ref 37.5–51.0)
Hemoglobin: 15.5 g/dL (ref 13.0–17.7)
Immature Grans (Abs): 0 10*3/uL (ref 0.0–0.1)
Immature Granulocytes: 0 %
Lymphocytes Absolute: 2 10*3/uL (ref 0.7–3.1)
Lymphs: 38 %
MCH: 28.9 pg (ref 26.6–33.0)
MCHC: 33 g/dL (ref 31.5–35.7)
MCV: 88 fL (ref 79–97)
Monocytes Absolute: 0.5 10*3/uL (ref 0.1–0.9)
Monocytes: 10 %
Neutrophils Absolute: 2.6 10*3/uL (ref 1.4–7.0)
Neutrophils: 48 %
Platelets: 182 10*3/uL (ref 150–450)
RBC: 5.36 x10E6/uL (ref 4.14–5.80)
RDW: 12.6 % (ref 11.6–15.4)
WBC: 5.3 10*3/uL (ref 3.4–10.8)

## 2022-08-19 LAB — IRON,TIBC AND FERRITIN PANEL
Ferritin: 672 ng/mL — ABNORMAL HIGH (ref 30–400)
Iron Saturation: 43 % (ref 15–55)
Iron: 91 ug/dL (ref 38–169)
Total Iron Binding Capacity: 214 ug/dL — ABNORMAL LOW (ref 250–450)
UIBC: 123 ug/dL (ref 111–343)

## 2022-08-19 LAB — HEPATITIS B SURFACE ANTIBODY,QUALITATIVE: Hep B Surface Ab, Qual: NONREACTIVE

## 2022-08-19 LAB — HEPATITIS B SURFACE AG, CONFIRM: HBsAG Confirmation: POSITIVE — AB

## 2022-08-19 LAB — HEPATITIS B E ANTIBODY: Hep B E Ab: NEGATIVE

## 2022-08-19 LAB — HEPATITIS B SURFACE ANTIGEN

## 2022-08-19 LAB — CERULOPLASMIN: Ceruloplasmin: 25.5 mg/dL (ref 16.0–31.0)

## 2022-08-19 LAB — CELIAC DISEASE AB SCREEN W/RFX
Antigliadin Abs, IgA: 5 units (ref 0–19)
Transglutaminase IgA: <2 U/mL (ref 0–3)

## 2022-08-19 LAB — HEPATITIS D QRT-PCR (SERUM): HDV qRT-PCR (serum): NOT DETECTED

## 2022-08-19 LAB — ALPHA-1-ANTITRYPSIN: A-1 Antitrypsin: 147 mg/dL (ref 101–187)

## 2022-08-19 LAB — ANA: Anti Nuclear Antibody (ANA): POSITIVE — AB

## 2022-08-19 LAB — HEPATITIS B E ANTIGEN: Hep B E Ag: POSITIVE — AB

## 2022-08-19 LAB — HEPATITIS B CORE ANTIBODY, TOTAL: Hep B Core Total Ab: POSITIVE — AB

## 2022-08-19 LAB — MITOCHONDRIAL/SMOOTH MUSCLE AB PNL
Mitochondrial Ab: <20 Units (ref 0.0–20.0)
Smooth Muscle Ab: 9 Units (ref 0–19)

## 2022-08-19 LAB — ANTI-MICROSOMAL ANTIBODY LIVER / KIDNEY: LKM1 Ab: 1.7 Units (ref 0.0–20.0)

## 2022-08-19 LAB — PROTIME-INR
INR: 1 (ref 0.9–1.2)
Prothrombin Time: 11 s (ref 9.1–12.0)

## 2022-08-19 LAB — HEPATITIS A ANTIBODY, TOTAL: hep A Total Ab: NEGATIVE

## 2022-09-14 ENCOUNTER — Ambulatory Visit: Payer: BC Managed Care – PPO | Admitting: Gastroenterology

## 2022-09-14 ENCOUNTER — Encounter: Payer: Self-pay | Admitting: Gastroenterology

## 2022-09-14 VITALS — BP 123/88 | HR 73 | Temp 98.7°F | Wt 234.0 lb

## 2022-09-14 DIAGNOSIS — Z23 Encounter for immunization: Secondary | ICD-10-CM | POA: Diagnosis not present

## 2022-09-14 DIAGNOSIS — B181 Chronic viral hepatitis B without delta-agent: Secondary | ICD-10-CM

## 2022-09-14 MED ORDER — TENOFOVIR ALAFENAMIDE FUMARATE 25 MG PO TABS: 25.0000 mg | ORAL_TABLET | Freq: Every day | ORAL | 2 refills | Status: DC

## 2022-09-14 NOTE — Progress Notes (Unsigned)
Wyline Mood MD, MRCP(U.K) [address]  [address]  [place], [place] [address]  Main: [phone]  Fax: [phone]   Primary Care Physician: Hannah Beat, MD  Primary Gastroenterologist:  Dr. Wyline Mood   Chief Complaint  Patient presents with   Hepatitis B    HPI: Alex Anderson is a 52 y.o. male   Summary of history :  Initially referred and seen on 08/10/2022 for hepatitis B. He was adopted and originally from [place].  He is not connected to his biological parents.  He has not known how long he has had hepatitis B.  Denies any illegal drug use, blood transfusion, [employer], tattoos.  He is married with a wife not active sexually for 1-1/2 years because he is getting her vaccinated and he has had his children tested and they are negative for hepatitis B as well.  Denies any excess alcohol consumption.  He was on Lipitor which has been held.     01/21/2022: Hepatitis B virus 6.10 million, creatinine 1.0, hepatitis B E antigen + 01/21/2022 AST 51 ALT 60   09/15/2021 median K PA of 7 during elastography.  Interval history   08/10/2022-09/14/2022  08/10/2022: HBsAg positive, hepatitis B core antibody positive hepatitis B E antigen positive hepatitis B E antibody negative HBs surface antibody negative, hepatitis D not detected, INR 1.0, hemoglobin 15.5 g platelet count 182, albumin 4.2.  AST 45 ALT 53.  Also underwent a full autoimmune that was negative and viral hepatitis screen not immune to hepatitis A     Current Outpatient Medications  Medication Sig Dispense Refill   amLODipine (NORVASC) 10 MG tablet Take 1 tablet by mouth once daily 90 tablet 3   fexofenadine (ALLEGRA) 180 MG tablet Take 180 mg by mouth daily.     fluticasone (FLONASE) 50 MCG/ACT nasal spray Place 2 sprays into both nostrils as needed. 16 g 5   Tenofovir Alafenamide Fumarate 25 MG TABS Take 1 tablet (25 mg total) by mouth daily. 90 tablet 2   No current facility-administered medications for  this visit.    Allergies as of 09/14/2022 - Review Complete 09/14/2022  Allergen Reaction Noted   Shrimp extract Hives 07/19/2022   Ace inhibitors  10/06/2009   Hydrocodone          ROS:  General: Negative for anorexia, weight loss, fever, chills, fatigue, weakness. ENT: Negative for hoarseness, difficulty swallowing , nasal congestion. CV: Negative for chest pain, angina, palpitations, dyspnea on exertion, peripheral edema.  Respiratory: Negative for dyspnea at rest, dyspnea on exertion, cough, sputum, wheezing.  GI: See history of present illness. GU:  Negative for dysuria, hematuria, urinary incontinence, urinary frequency, nocturnal urination.  Endo: Negative for unusual weight change.    Physical Examination:   BP (!) 144/95   Pulse 72   Temp 98.7 F (37.1 C) (Oral)   Wt 234 lb (106.1 kg)   BMI 36.11 kg/m   General: Well-nourished, well-developed in no acute distress.  Eyes: No icterus. Conjunctivae pink. Mouth: Oropharyngeal mucosa moist and pink , no lesions erythema or exudate. Lungs: Clear to auscultation bilaterally. Non-labored. Heart: Regular rate and rhythm, no murmurs rubs or gallops.  Abdomen: Bowel sounds are normal, nontender, nondistended, no hepatosplenomegaly or masses, no abdominal bruits or hernia , no rebound or guarding.   Extremities: No lower extremity edema. No clubbing or deformities. Neuro: Alert and oriented x 3.  Grossly intact. Skin: Warm and dry, no jaundice.   Psych: Alert and cooperative, normal  mood and affect.   Imaging Studies: No results found.  Assessment and Plan:   Alex Anderson is a 53 y.o. y/o male who has a history of likely immune tolerant chronic hepatitis B: Viral load of 6.1 million, no biochemical evidence of liver cirrhosis, ultrasound elastography median K PA of 7.Elevated transaminases.  Hepatitis B DNA was 5.75 million in April 2023 and subsequently was 6.1 million when rechecked in 01/20/2022.  Transaminase has  been usually less than 2 times the upper limit of normal  Plan 1.  Have household contacts vaccinated, use barrier protection during sexual intercourse if partner is not vaccinated, do not share toothbrushes or razors, do not share injection equipment,  do not share  any glucose testing equipment, cover open cuts and scratches, clean blood spills with bleach solution.  Patient visit has been provided at initial visit as well as reiterated today   2.  Requires hepatitis a  vaccine  3.  Since he has had hepatitis B likely for many years probably vertically transmitted he would be at increased risk for hepatocellular carcinoma hence I would give him the benefit of treatment And will commence him on treatment with tenofovir alafenamide.  J will repeat hepatitis DNA in 3 months until undetectable for at least 2 consecutive visits then we will decrease the frequency to every 6 months.  LFTs will be checked every 3 months and will be decreased in frequency to every 6 months once hepatitis B virus is undetectable.  Hepatitis B E antibody every 12 months to check for seroconversion hepatitis surface antigen to be tested yearly with undetectable hepatitis B DNA.   Dr Wyline Mood  MD,MRCP [facility]) Follow up in 4 months

## 2022-09-14 NOTE — Patient Instructions (Signed)
Please come back to the clinic in 3 months from today (approximate: 12/15/2022) to have labs drawn.   Lab Hours: Monday and Thursday 1:30 PM-4:30 PM Tuesday and Wednesday 8:30 AM-4:30 PM

## 2022-09-16 ENCOUNTER — Encounter: Payer: Self-pay | Admitting: Gastroenterology

## 2022-09-23 ENCOUNTER — Telehealth: Payer: Self-pay | Admitting: Gastroenterology

## 2022-09-23 NOTE — Telephone Encounter (Signed)
Pt left message would like a call back to discuss co-pay over 5,000.00 he would like to know if theres alternative

## 2022-09-28 NOTE — Telephone Encounter (Signed)
Sent patient instructions via MyChart to go online and apply for assistance.

## 2022-10-06 ENCOUNTER — Other Ambulatory Visit: Payer: Self-pay

## 2022-10-06 ENCOUNTER — Telehealth: Payer: Self-pay

## 2022-10-06 MED ORDER — VEMLIDY 25 MG PO TABS: 1.0000 | ORAL_TABLET | Freq: Every day | ORAL | 11 refills | Status: DC

## 2022-10-06 NOTE — Telephone Encounter (Signed)
Called patient's insurance again and asked for their specialty pharmacy's number. It was provided and it's the following [phone]-[facility]. I had to leave a message for them to call me back.

## 2022-10-06 NOTE — Telephone Encounter (Signed)
Dr Patsy Lager , the patient needs to start on medication for his Hep B but he is required to call for financial assistance and is out of our staffs hands. He has apparently not been willing to do so - please see message from my CMA. Please do let us know if we can help in any other way .   Regards   Deborha Moseley

## 2022-10-06 NOTE — Telephone Encounter (Signed)
Thank you   Analysia Dungee 

## 2022-10-07 ENCOUNTER — Other Ambulatory Visit: Payer: Self-pay

## 2022-10-07 NOTE — Telephone Encounter (Signed)
Called [facility] to [phone] and they stated that they had received our prescription for the patient but that his insurance was inactive. I told them that how can it be inactive if I called his insurance yesterday and they stated that it was active and that it didn't need prior-authorization because it was covered. Then they stated that they would be reaching out to the patient to obtain the correct information. Therefore, I notified the patient via MyChart message.

## 2022-10-08 ENCOUNTER — Other Ambulatory Visit: Payer: Self-pay | Admitting: Family Medicine

## 2022-10-08 NOTE — Telephone Encounter (Signed)
Refill for amLODipine (NORVASC) 10 MG tablet   LOV-

## 2022-10-18 ENCOUNTER — Ambulatory Visit (INDEPENDENT_AMBULATORY_CARE_PROVIDER_SITE_OTHER): Payer: BC Managed Care – PPO | Admitting: Gastroenterology

## 2022-10-18 DIAGNOSIS — Z23 Encounter for immunization: Secondary | ICD-10-CM

## 2022-10-20 NOTE — Progress Notes (Signed)
Pt here for twinrix vaccine.

## 2022-10-22 ENCOUNTER — Encounter: Payer: Self-pay | Admitting: Gastroenterology

## 2022-10-22 ENCOUNTER — Encounter: Payer: Self-pay | Admitting: Family Medicine

## 2022-10-22 MED ORDER — AMLODIPINE BESYLATE 10 MG PO TABS: 10.0000 mg | ORAL_TABLET | Freq: Every day | ORAL | 0 refills | Status: DC

## 2022-10-27 MED ORDER — VEMLIDY 25 MG PO TABS: 1.0000 | ORAL_TABLET | Freq: Every day | ORAL | 11 refills | Status: DC

## 2022-12-01 ENCOUNTER — Encounter (INDEPENDENT_AMBULATORY_CARE_PROVIDER_SITE_OTHER): Payer: Self-pay

## 2022-12-28 ENCOUNTER — Encounter: Payer: Self-pay | Admitting: Family Medicine

## 2022-12-28 NOTE — Progress Notes (Unsigned)
Elycia Woodside T. Parilee Hally, MD, CAQ [facility] [address] [place], [address]  Phone: [phone]  FAX: [phone]  Joshuajames Aydelott - 52 y.o. male  MRN [id number]  Date of Birth: 1970-06-14  Date: 12/29/2022  PCP: Hannah Beat, MD  Referral: Hannah Beat, MD  No chief complaint on file.  Patient Care Team: Hannah Beat, MD as PCP - General Subjective:   Jah Mcqueary is a 52 y.o. pleasant patient who presents with the following:  Preventative Health Maintenance Visit:  Health Maintenance Summary Reviewed and updated, unless pt declines services.  Tobacco History Reviewed. Alcohol: No concerns, no excessive use Exercise Habits: Some activity, rec at least 30 mins 5 times a week STD concerns: no risk or activity to increase risk Drug Use: None  Shingrix   Health Maintenance  Topic Date Due   Zoster Vaccines- Shingrix (1 of 2) Never done   COVID-19 Vaccine (4 - 2023-24 season) 01/01/2022   INFLUENZA VACCINE  12/02/2022   DTaP/Tdap/Td (2 - Td or Tdap) 07/16/2025   Colonoscopy  05/10/2027   Hepatitis C Screening  Completed   HIV Screening  Completed   HPV VACCINES  Aged Out   Immunization History  Administered Date(s) Administered   Hepatitis A, Adult 09/14/2022, 10/18/2022   Influenza,inj,Quad PF,6+ Mos 03/11/2014, 02/05/2019, 03/03/2020   PFIZER(Purple Top)SARS-COV-2 Vaccination 06/04/2019, 06/25/2019, 02/20/2020   PPD Test 03/01/2014, 08/17/2016   Tdap 07/17/2015   Patient Active Problem List   Diagnosis Date Noted   Hemorrhoids 07/19/2022   Chronic hepatitis B (HCC) 07/24/2021   Prediabetes 03/02/2020   Allergic rhinitis due to pollen 11/06/2010   HYPERLIPIDEMIA 01/22/2008   HYPERTENSION 01/22/2008    Past Medical History:  Diagnosis Date   Allergic rhinitis due to pollen    Chronic hepatitis B (HCC) 07/24/2021   Hyperlipidemia    Hypertension     Past Surgical History:  Procedure  Laterality Date   COLONOSCOPY WITH PROPOFOL N/A 05/09/2020   Procedure: COLONOSCOPY WITH PROPOFOL;  Surgeon: Wyline Mood, MD;  Location: [facility] ENDOSCOPY;  Service: Gastroenterology;  Laterality: N/A;   WISDOM TOOTH EXTRACTION      Family History  Adopted: Yes    Social History   Social History Narrative   Not on file    Past Medical History, Surgical History, Social History, Family History, Problem List, Medications, and Allergies have been reviewed and updated if relevant.  Review of Systems: Pertinent positives are listed above.  Otherwise, a full 14 point review of systems has been done in full and it is negative except where it is noted positive.  Objective:   There were no vitals taken for this visit. Ideal Body Weight:    Ideal Body Weight:   No results found.    07/19/2022    8:52 AM 01/21/2022   11:11 AM 10/06/2021    8:30 AM 08/18/2021    8:36 AM 07/08/2021    2:18 PM  Depression screen PHQ 2/9  Decreased Interest 0 0 0 0 0  Down, Depressed, Hopeless 0 0 0 0 0  PHQ - 2 Score 0 0 0 0 0  Difficult doing work/chores Not difficult at all         GEN: well developed, well nourished, no acute distress Eyes: conjunctiva and lids normal, PERRLA, EOMI ENT: TM clear, nares clear, oral exam WNL Neck: supple, no lymphadenopathy, no thyromegaly, no JVD Pulm: clear to auscultation and percussion, respiratory effort normal CV: regular rate and  rhythm, S1-S2, no murmur, rub or gallop, no bruits, peripheral pulses normal and symmetric, no cyanosis, clubbing, edema or varicosities GI: soft, non-tender; no hepatosplenomegaly, masses; active bowel sounds all quadrants GU: deferred Lymph: no cervical, axillary or inguinal adenopathy MSK: gait normal, muscle tone and strength WNL, no joint swelling, effusions, discoloration, crepitus  SKIN: clear, good turgor, color WNL, no rashes, lesions, or ulcerations Neuro: normal mental status, normal strength, sensation, and motion Psych: alert;  oriented to person, place and time, normally interactive and not anxious or depressed in appearance.  All labs reviewed with patient.   Assessment and Plan:     ICD-10-CM   1. Healthcare maintenance  Z00.00       Health Maintenance Exam: The patient's preventative maintenance and recommended screening tests for an annual wellness exam were reviewed in full today. Brought up to date unless services declined.  Counselled on the importance of diet, exercise, and its role in overall health and mortality. The patient's FH and SH was reviewed, including their home life, tobacco status, and drug and alcohol status.  Follow-up in 1 year for physical exam or additional follow-up below.  Disposition: No follow-ups on file.  No orders of the defined types were placed in this encounter.  There are no discontinued medications. No orders of the defined types were placed in this encounter.   Signed,  Elpidio Galea. Henretter Piekarski, MD   Allergies as of 12/29/2022       Reactions   Shrimp Extract Hives   Ace Inhibitors    REACTION: cough   Hydrocodone         Medication List        Accurate as of December 28, 2022  2:38 PM. If you have any questions, ask your nurse or doctor.          amLODipine 10 MG tablet Commonly known as: NORVASC Take 1 tablet (10 mg total) by mouth daily.   fexofenadine 180 MG tablet Commonly known as: ALLEGRA Take 180 mg by mouth daily.   fluticasone 50 MCG/ACT nasal spray Commonly known as: FLONASE Place 2 sprays into both nostrils as needed.   Vemlidy 25 MG Tabs Generic drug: Tenofovir Alafenamide Fumarate Take 1 tablet (25 mg total) by mouth daily.

## 2022-12-29 ENCOUNTER — Encounter: Payer: Self-pay | Admitting: Family Medicine

## 2022-12-29 ENCOUNTER — Ambulatory Visit (INDEPENDENT_AMBULATORY_CARE_PROVIDER_SITE_OTHER): Payer: BC Managed Care – PPO | Admitting: Family Medicine

## 2022-12-29 VITALS — BP 120/88 | HR 80 | Temp 98.0°F | Ht 67.5 in | Wt 227.0 lb

## 2022-12-29 DIAGNOSIS — Z0000 Encounter for general adult medical examination without abnormal findings: Secondary | ICD-10-CM | POA: Diagnosis not present

## 2022-12-29 DIAGNOSIS — R7989 Other specified abnormal findings of blood chemistry: Secondary | ICD-10-CM

## 2022-12-29 DIAGNOSIS — E782 Mixed hyperlipidemia: Secondary | ICD-10-CM | POA: Diagnosis not present

## 2022-12-29 DIAGNOSIS — Z79899 Other long term (current) drug therapy: Secondary | ICD-10-CM

## 2022-12-29 DIAGNOSIS — B181 Chronic viral hepatitis B without delta-agent: Secondary | ICD-10-CM | POA: Diagnosis not present

## 2022-12-29 DIAGNOSIS — Z23 Encounter for immunization: Secondary | ICD-10-CM

## 2022-12-29 DIAGNOSIS — R7303 Prediabetes: Secondary | ICD-10-CM | POA: Diagnosis not present

## 2022-12-29 DIAGNOSIS — Z125 Encounter for screening for malignant neoplasm of prostate: Secondary | ICD-10-CM | POA: Diagnosis not present

## 2022-12-29 LAB — CBC WITH DIFFERENTIAL/PLATELET
Basophils Absolute: 0 10*3/uL (ref 0.0–0.1)
Basophils Relative: 0.8 % (ref 0.0–3.0)
Eosinophils Absolute: 0.1 10*3/uL (ref 0.0–0.7)
Eosinophils Relative: 2.3 % (ref 0.0–5.0)
HCT: 46.4 % (ref 39.0–52.0)
Hemoglobin: 15 g/dL (ref 13.0–17.0)
Lymphocytes Relative: 41.7 % (ref 12.0–46.0)
Lymphs Abs: 2 10*3/uL (ref 0.7–4.0)
MCHC: 32.4 g/dL (ref 30.0–36.0)
MCV: 91.6 fl (ref 78.0–100.0)
Monocytes Absolute: 0.5 10*3/uL (ref 0.1–1.0)
Monocytes Relative: 10.2 % (ref 3.0–12.0)
Neutro Abs: 2.2 10*3/uL (ref 1.4–7.7)
Neutrophils Relative %: 45 % (ref 43.0–77.0)
Platelets: 170 10*3/uL (ref 150.0–400.0)
RBC: 5.06 Mil/uL (ref 4.22–5.81)
RDW: 13.8 % (ref 11.5–15.5)
WBC: 4.8 10*3/uL (ref 4.0–10.5)

## 2022-12-29 LAB — BASIC METABOLIC PANEL
BUN: 14 mg/dL (ref 6–23)
CO2: 30 mEq/L (ref 19–32)
Calcium: 9.7 mg/dL (ref 8.4–10.5)
Chloride: 101 mEq/L (ref 96–112)
Creatinine, Ser: 1.05 mg/dL (ref 0.40–1.50)
GFR: 82.99 mL/min (ref >60.00)
Glucose, Bld: 108 mg/dL — ABNORMAL HIGH (ref 70–99)
Potassium: 4.3 mEq/L (ref 3.5–5.1)
Sodium: 137 mEq/L (ref 135–145)

## 2022-12-29 LAB — HEPATIC FUNCTION PANEL
ALT: 93 U/L — ABNORMAL HIGH (ref 0–53)
AST: 67 U/L — ABNORMAL HIGH (ref 0–37)
Albumin: 4.2 g/dL (ref 3.5–5.2)
Alkaline Phosphatase: 63 U/L (ref 39–117)
Bilirubin, Direct: 0.1 mg/dL (ref 0.0–0.3)
Total Bilirubin: 0.7 mg/dL (ref 0.2–1.2)
Total Protein: 8.1 g/dL (ref 6.0–8.3)

## 2022-12-29 LAB — HEMOGLOBIN A1C: Hgb A1c MFr Bld: 6 % (ref 4.6–6.5)

## 2022-12-29 LAB — LIPID PANEL
Cholesterol: 225 mg/dL — ABNORMAL HIGH (ref 0–200)
HDL: 48.6 mg/dL (ref >39.00)
LDL Cholesterol: 150 mg/dL — ABNORMAL HIGH (ref 0–99)
NonHDL: 176.46
Total CHOL/HDL Ratio: 5
Triglycerides: 132 mg/dL (ref 0.0–149.0)
VLDL: 26.4 mg/dL (ref 0.0–40.0)

## 2022-12-31 LAB — PSA, TOTAL WITH REFLEX TO PSA, FREE: PSA, Total: 0.3 ng/mL (ref <=4.0)

## 2023-01-17 ENCOUNTER — Encounter: Payer: Self-pay | Admitting: Gastroenterology

## 2023-01-17 ENCOUNTER — Other Ambulatory Visit: Payer: Self-pay | Admitting: Family Medicine

## 2023-01-17 ENCOUNTER — Ambulatory Visit: Payer: BC Managed Care – PPO | Admitting: Gastroenterology

## 2023-01-17 ENCOUNTER — Other Ambulatory Visit: Payer: Self-pay

## 2023-01-17 VITALS — BP 135/84 | HR 73 | Temp 98.3°F | Ht 67.5 in | Wt 232.4 lb

## 2023-01-17 DIAGNOSIS — B181 Chronic viral hepatitis B without delta-agent: Secondary | ICD-10-CM

## 2023-01-17 NOTE — Progress Notes (Signed)
Wyline Mood MD, MRCP(U.K) [address]  [address]  [place], [place] [address]  Main: [phone]  Fax: [phone]   Primary Care Physician: Hannah Beat, MD  Primary Gastroenterologist:  Dr. Wyline Mood   Chief Complaint  Patient presents with   Follow up Hepatitis B    HPI: Alex Anderson is a 52 y.o. male Summary of history :   Initially referred and seen on 08/10/2022 for hepatitis B. He was adopted and originally from [place].  He is not connected to his biological parents.  He has not known how long he has had hepatitis B.  Denies any illegal drug use, blood transfusion, [employer], tattoos.  He is married with a wife not active sexually for 1-1/2 years because he is getting her vaccinated and he has had his children tested and they are negative for hepatitis B as well.  Denies any excess alcohol consumption.  He was on Lipitor which has been held.     01/21/2022: Hepatitis B virus 6.10 million, creatinine 1.0, hepatitis B E antigen + 01/21/2022 AST 51 ALT 60   09/15/2021 median K PA of 7 during elastography. 08/10/2022: HBsAg positive, hepatitis B core antibody positive hepatitis B E antigen positive hepatitis B E antibody negative HBs surface antibody negative, hepatitis D not detected, INR 1.0, hemoglobin 15.5 g platelet count 182, albumin 4.2.  AST 45 ALT 53.    Interval history  09/14/2022-01/17/2023 He is doing well no complaints.  Taking his medications daily.    Also underwent a full autoimmune that was negative and viral hepatitis screen not immune to hepatitis A Current Outpatient Medications  Medication Sig Dispense Refill   amLODipine (NORVASC) 10 MG tablet Take 1 tablet by mouth once daily 90 tablet 3   fexofenadine (ALLEGRA) 180 MG tablet Take 180 mg by mouth daily.     fluticasone (FLONASE) 50 MCG/ACT nasal spray Place 2 sprays into both nostrils as needed. 16 g 5   Tenofovir Alafenamide Fumarate (VEMLIDY) 25 MG TABS Take 1 tablet (25 mg total) by  mouth daily. 30 tablet 11   No current facility-administered medications for this visit.    Allergies as of 01/17/2023 - Review Complete 01/17/2023  Allergen Reaction Noted   Shrimp extract Hives 07/19/2022   Ace inhibitors  10/06/2009   Hydrocodone       ROS:  General: Negative for anorexia, weight loss, fever, chills, fatigue, weakness. ENT: Negative for hoarseness, difficulty swallowing , nasal congestion. CV: Negative for chest pain, angina, palpitations, dyspnea on exertion, peripheral edema.  Respiratory: Negative for dyspnea at rest, dyspnea on exertion, cough, sputum, wheezing.  GI: See history of present illness. GU:  Negative for dysuria, hematuria, urinary incontinence, urinary frequency, nocturnal urination.  Endo: Negative for unusual weight change.    Physical Examination:   BP 135/84 (BP Location: Left Arm, Patient Position: Sitting, Cuff Size: Large)   Pulse 73   Temp 98.3 F (36.8 C) (Oral)   Ht 5' 7.5" (1.715 m)   Wt 232 lb 6.4 oz (105.4 kg)   BMI 35.86 kg/m   General: Well-nourished, well-developed in no acute distress.  Eyes: No icterus. Conjunctivae pink. Skin: Warm and dry, no jaundice.   Psych: Alert and cooperative, normal mood and affect.   Imaging Studies: No results found.  Assessment and Plan:   [name] is a 52 y.o. 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  Hepatitis B DNA was 5.75 million in April 2023 and subsequently was 6.1 million when rechecked in 01/20/2022.  Transaminase has been usually less than 2 times the upper limit of normal. Since he has had hepatitis B likely for many years probably vertically transmitted he would be at increased risk for hepatocellular carcinoma , commenced treatment with tenofovir alafenamide in 09/2022.    Plan 1.  Complete Hep A  vaccination series    3.  Check hepatitis B viral load, E antigen surface antigen status.  Check CMP.  Will recheck it every 3 months ,  decreased in frequency to every 6 months once hepatitis B virus is undetectable.  Hepatitis B E antibody every 12 months to check for seroconversion hepatitis surface antigen to be tested yearly with undetectable hepatitis B DNA.  Right upper quadrant ultrasound to screen for [facility] since family history unknown.    Dr Wyline Mood  MD,MRCP [facility] Follow up in 4 months

## 2023-01-18 LAB — HEPATITIS B DNA, ULTRAQUANTITATIVE, PCR
HBV DNA SERPL PCR-ACNC: 12800 [IU]/mL
HBV DNA SERPL PCR-LOG IU: 4.107 {Log_IU}/mL

## 2023-01-18 LAB — HEPATITIS B SURFACE AG, CONFIRM: HBsAG Confirmation: POSITIVE — AB

## 2023-01-18 LAB — HEPATITIS B SURFACE ANTIGEN

## 2023-01-18 LAB — HEPATITIS B E ANTIGEN: Hep B E Ag: POSITIVE — AB

## 2023-01-20 ENCOUNTER — Ambulatory Visit
Admission: RE | Admit: 2023-01-20 | Discharge: 2023-01-20 | Disposition: A | Discharge status: Home / Self Care | Payer: BC Managed Care – PPO | Source: Ambulatory Visit | Attending: Gastroenterology | Admitting: Gastroenterology

## 2023-01-20 DIAGNOSIS — K7460 Unspecified cirrhosis of liver: Secondary | ICD-10-CM | POA: Diagnosis not present

## 2023-01-20 DIAGNOSIS — K7689 Other specified diseases of liver: Secondary | ICD-10-CM | POA: Diagnosis not present

## 2023-01-20 DIAGNOSIS — B181 Chronic viral hepatitis B without delta-agent: Secondary | ICD-10-CM | POA: Diagnosis not present

## 2023-01-21 ENCOUNTER — Telehealth: Payer: Self-pay

## 2023-01-21 NOTE — Telephone Encounter (Signed)
-----   Message from Wyline Mood sent at 01/20/2023 12:04 PM EDT ----- Can we check why cmp was not done, viral load has dropped well .

## 2023-01-21 NOTE — Telephone Encounter (Signed)
Hep B DNA is resulted was resulted on 01/18/2023  Hepatitis B DNA, ultraquantitative, PCR Order: [id number] Status: Final result     Visible to patient: Yes (seen)     Next appt: 03/01/2023 at 09:30 AM in Family Medicine ([facility] Nurse)     Dx: Chronic hepatitis B (HCC)   0 Result Notes     1 Follow-up Encounter      Component Ref Range & Units 4 d ago (01/17/23) 1 yr ago (01/21/22) 1 yr ago (08/18/21)  HBV DNA SERPL PCR-ACNC IU/mL 12,800 See Final Results See Final Results  HBV DNA SERPL PCR-LOG IU log10 IU/mL 4.107    Test Information: Comment    Comment: The reportable range for this assay is 10 IU/mL to 1 billion IU/mL.

## 2023-01-21 NOTE — Telephone Encounter (Signed)
Called [facility] at [phone] and they state CMP did not get added to the blood work. They do still have blood sample left so she going to try to add on the test to the lab work. What lab test are you referring to about the Viral load? I do not see any other test that was order that are not back.

## 2023-03-01 ENCOUNTER — Ambulatory Visit (INDEPENDENT_AMBULATORY_CARE_PROVIDER_SITE_OTHER): Payer: BC Managed Care – PPO

## 2023-03-01 DIAGNOSIS — Z23 Encounter for immunization: Secondary | ICD-10-CM | POA: Diagnosis not present

## 2023-03-01 NOTE — Progress Notes (Signed)
Patient presented for 2nd shingles vaccine given by Deanza Upperman, CMA to left deltoid, patient voiced no concerns nor showed any signs of distress during injection.  

## 2023-03-21 ENCOUNTER — Ambulatory Visit: Payer: BC Managed Care – PPO | Admitting: Gastroenterology

## 2023-03-21 DIAGNOSIS — Z23 Encounter for immunization: Secondary | ICD-10-CM

## 2023-04-12 NOTE — Progress Notes (Signed)
err

## 2023-04-14 LAB — COMPREHENSIVE METABOLIC PANEL
ALT: 63 [IU]/L — ABNORMAL HIGH (ref 0–44)
AST: 47 [IU]/L — ABNORMAL HIGH (ref 0–40)
Albumin: 4.3 g/dL (ref 4.1–5.1)
Alkaline Phosphatase: 64 [IU]/L (ref 44–121)
BUN/Creatinine Ratio: 13 (ref 9–20)
BUN: 15 mg/dL (ref 6–24)
Bilirubin Total: 0.2 mg/dL (ref 0.0–1.2)
CO2: 20 mmol/L (ref 20–29)
Calcium: 9.1 mg/dL (ref 8.7–10.2)
Chloride: 98 mmol/L (ref 96–106)
Creatinine, Ser: 1.18 mg/dL (ref 0.76–1.27)
Globulin, Total: 3.4 g/dL (ref 1.5–4.5)
Glucose: 162 mg/dL — ABNORMAL HIGH (ref 70–99)
Potassium: 4.1 mmol/L (ref 3.5–5.2)
Sodium: 135 mmol/L (ref 134–144)
Total Protein: 7.7 g/dL (ref 6.0–8.5)
eGFR: 75 mL/min/{1.73_m2} (ref >59)

## 2023-04-14 LAB — SPECIMEN STATUS REPORT

## 2023-06-07 IMAGING — US US ABDOMEN LIMITED
1 series · 14 of 25 positions shown · non-contrast
Comparison: None.

CLINICAL DATA: Elevated LFTs.

EXAM:
ULTRASOUND ABDOMEN LIMITED RIGHT UPPER QUADRANT

[Series 1: us abdomen limited ruq (liver/gb) · 14 of 46 slices shown]
[im 1/46]
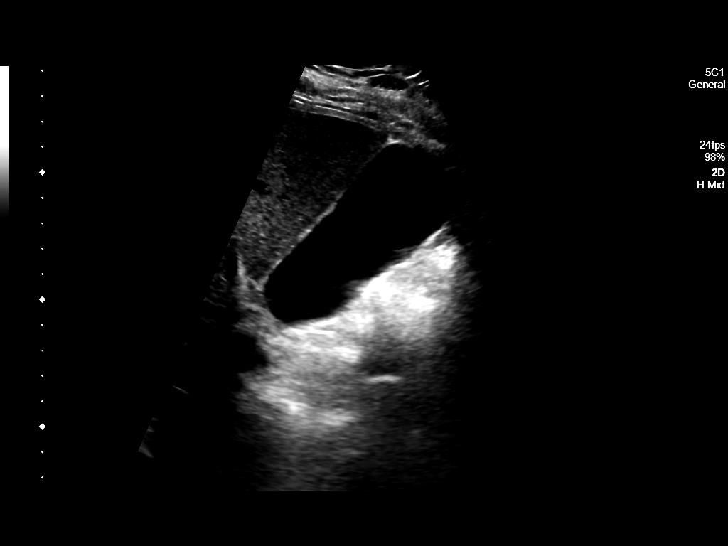
[im 4/46]
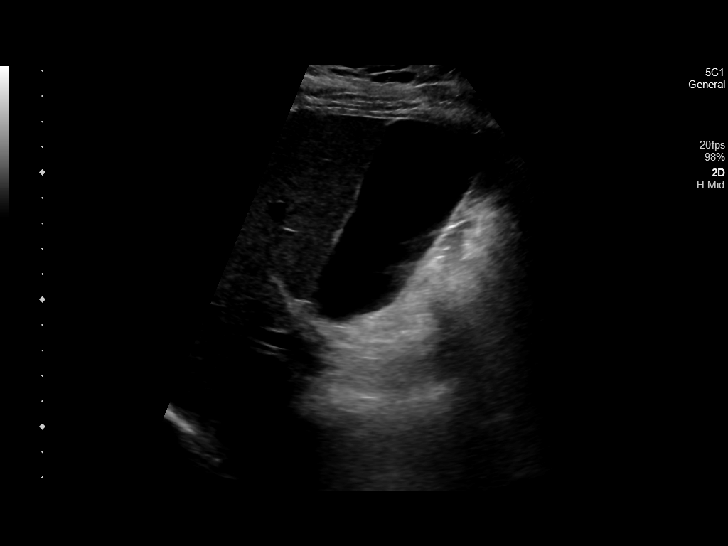
[im 8/46]
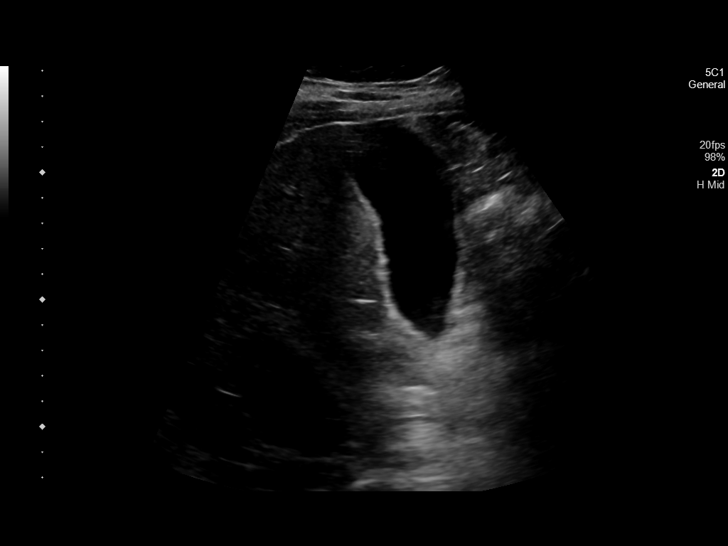
[im 12/46]
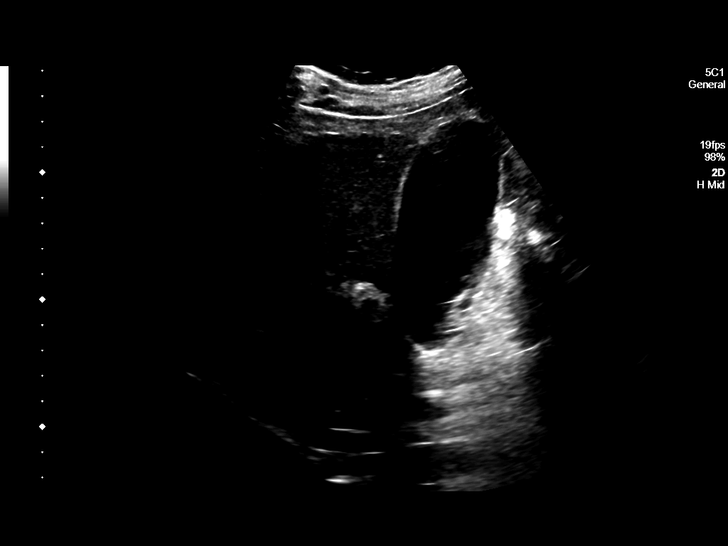
[im 16/46]
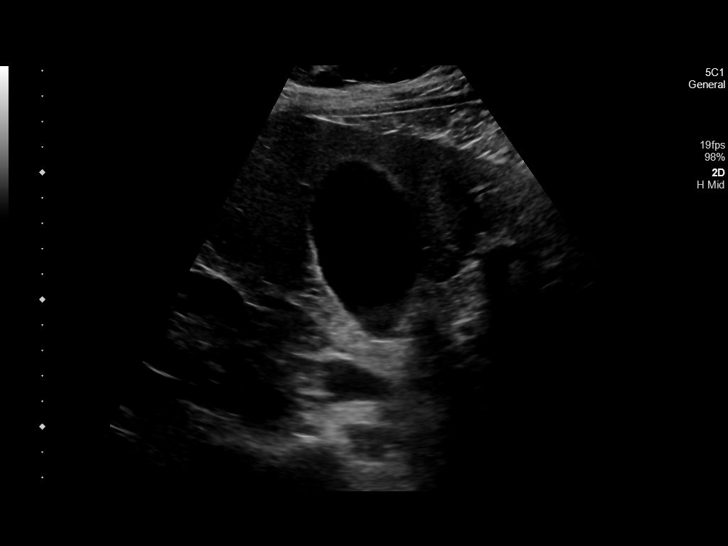
[im 17/46]
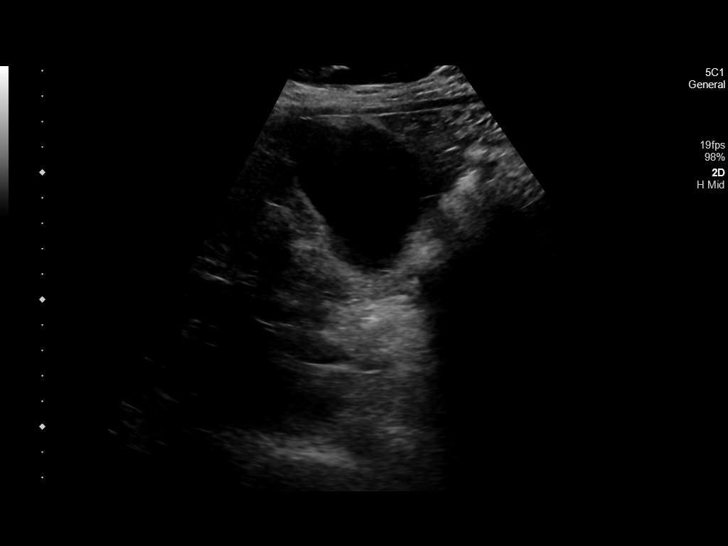
[im 21/46]
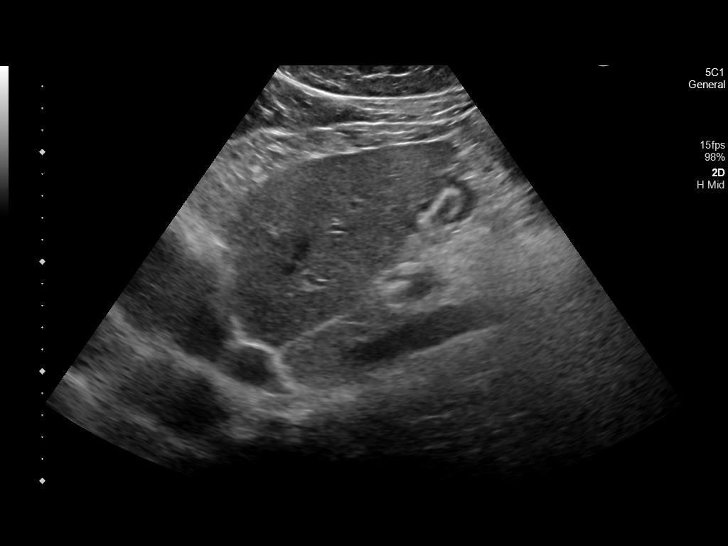
[im 25/46]
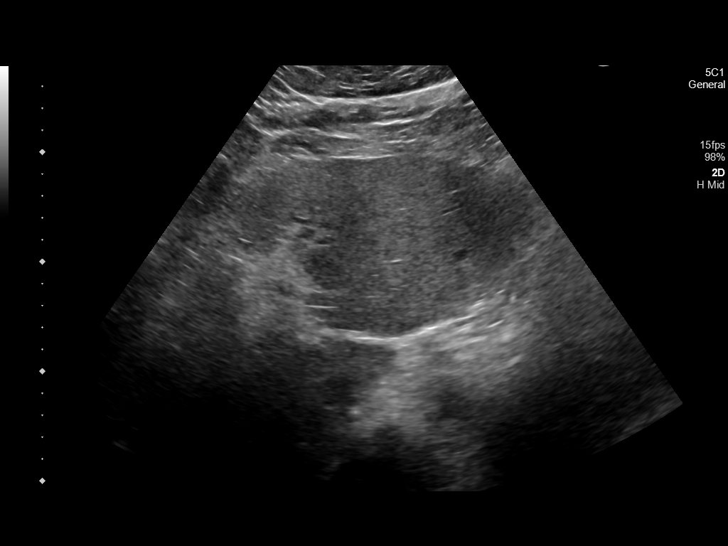
[im 29/46]
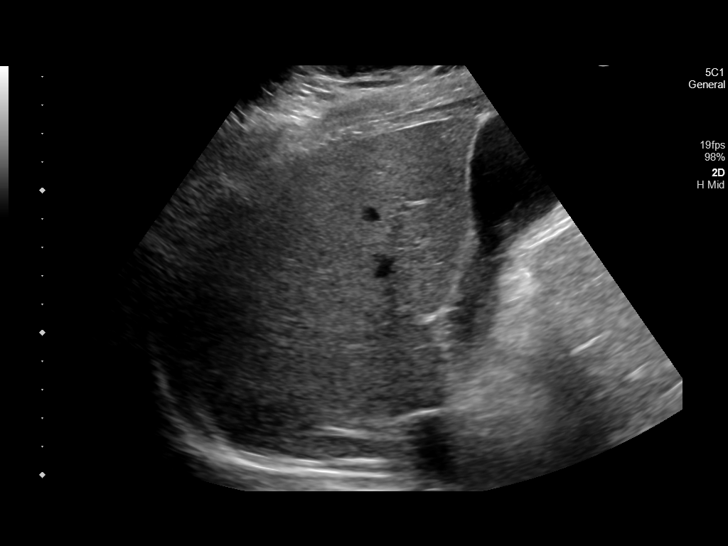
[im 31/46]
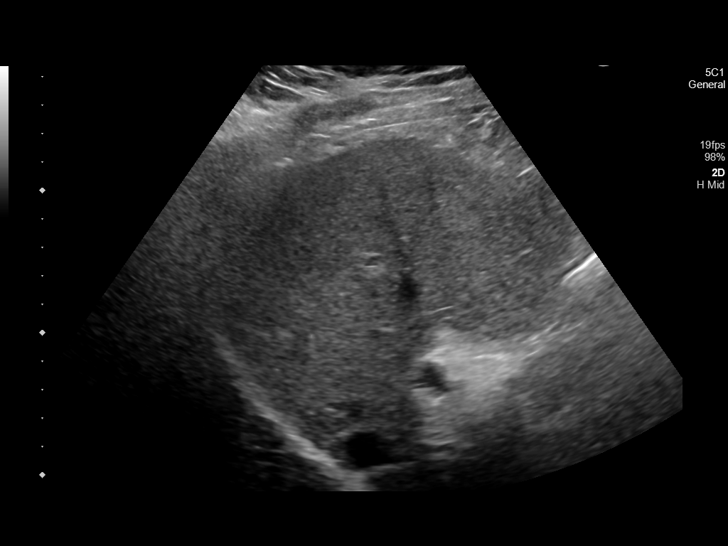
[im 34/46]
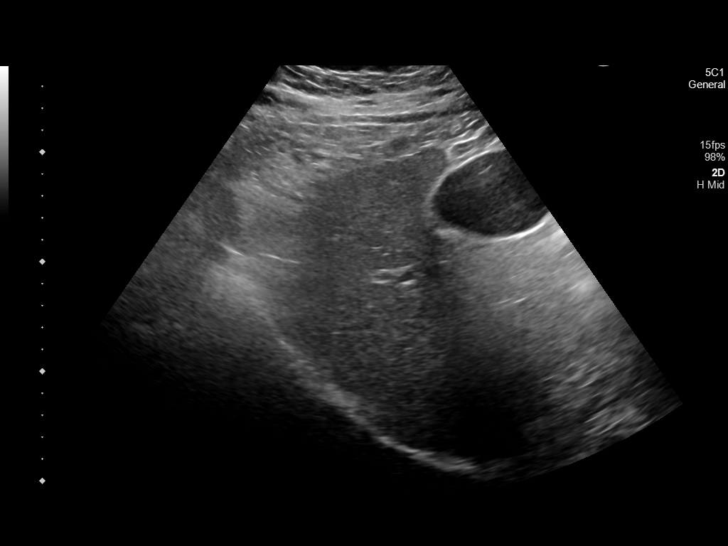
[im 38/46]
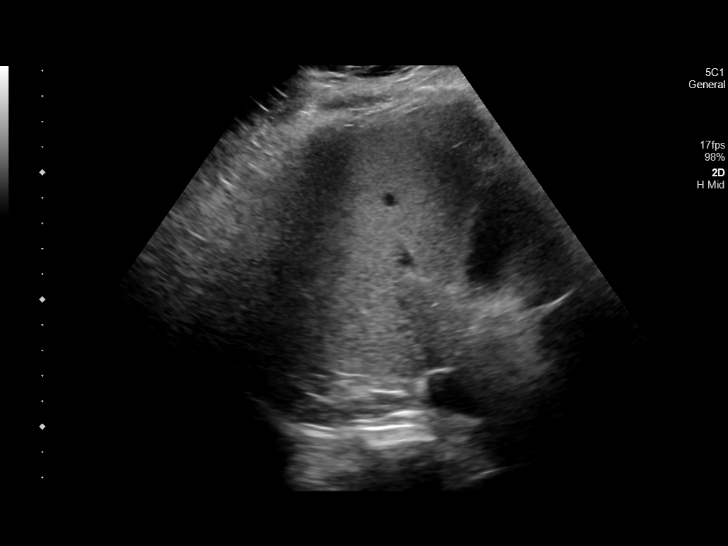
[im 42/46]
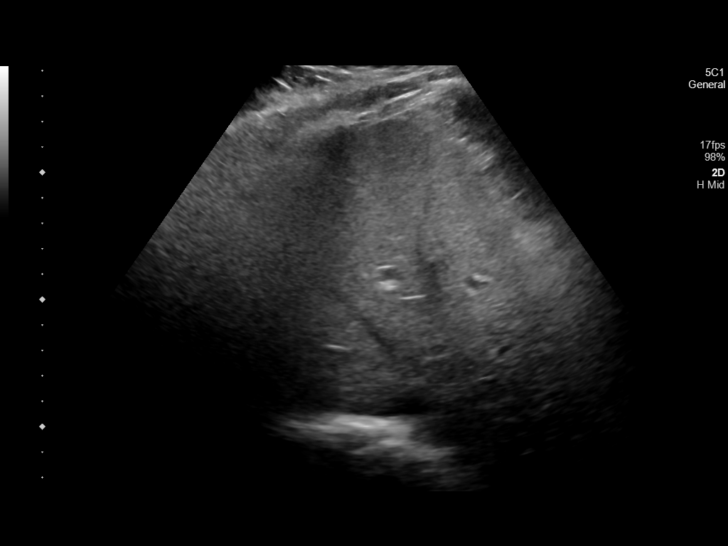
[im 46/46]
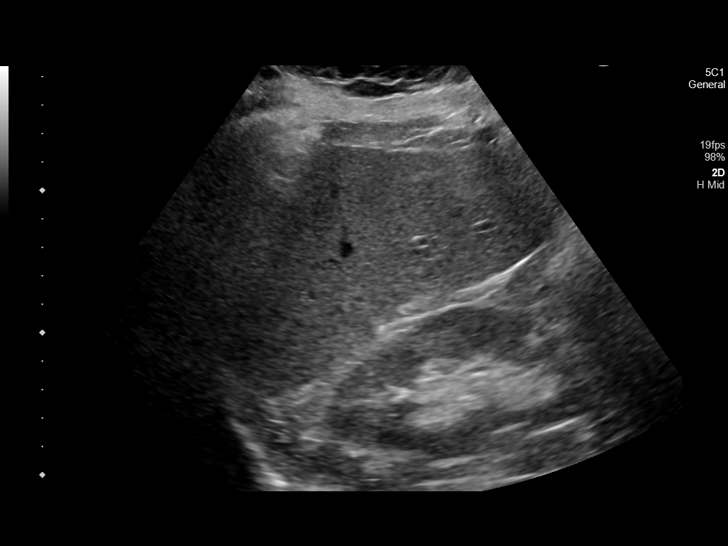

[14 of 25 positions shown; findings below may reference images not displayed]

FINDINGS: Gallbladder:

No gallstones or wall thickening visualized. No sonographic Murphy
sign noted by sonographer.

Common bile duct:

Diameter: 4 mm

Liver:

No focal lesion identified. Within normal limits in parenchymal
echogenicity. Portal vein is patent on color Doppler imaging with
normal direction of blood flow towards the liver.

Other: None.
IMPRESSION: Unremarkable right upper quadrant ultrasound.

## 2023-10-25 ENCOUNTER — Other Ambulatory Visit: Payer: Self-pay | Admitting: Gastroenterology

## 2024-01-06 ENCOUNTER — Other Ambulatory Visit: Payer: Self-pay | Admitting: Family Medicine

## 2024-01-06 NOTE — Telephone Encounter (Signed)
 Please schedule CPE with fasting labs prior with Dr. Patsy Lager.

## 2024-01-06 NOTE — Telephone Encounter (Signed)
 I called patient and left a voicemail for him to be scheduled.

## 2024-01-11 ENCOUNTER — Telehealth: Payer: Self-pay | Admitting: Family Medicine

## 2024-01-11 NOTE — Telephone Encounter (Signed)
 Copied from CRM #[id number]. Topic: Clinical - Request for Lab/Test Order >> Jan 10, 2024  3:39 PM Alex Anderson wrote: Reason for CRM: Patient is requesting labs prior to his physical appointment, please place orders and notify the patient.

## 2024-01-11 NOTE — Telephone Encounter (Signed)
 I just called patient and he is scheduled for his lab appt for this Friday, can the orders be placed? Thanks.

## 2024-01-13 ENCOUNTER — Ambulatory Visit: Payer: Self-pay | Admitting: Family Medicine

## 2024-01-13 ENCOUNTER — Other Ambulatory Visit (INDEPENDENT_AMBULATORY_CARE_PROVIDER_SITE_OTHER)

## 2024-01-13 DIAGNOSIS — Z0000 Encounter for general adult medical examination without abnormal findings: Secondary | ICD-10-CM | POA: Diagnosis not present

## 2024-01-13 DIAGNOSIS — R7303 Prediabetes: Secondary | ICD-10-CM | POA: Diagnosis not present

## 2024-01-13 DIAGNOSIS — R7989 Other specified abnormal findings of blood chemistry: Secondary | ICD-10-CM | POA: Diagnosis not present

## 2024-01-13 DIAGNOSIS — Z79899 Other long term (current) drug therapy: Secondary | ICD-10-CM

## 2024-01-13 DIAGNOSIS — E782 Mixed hyperlipidemia: Secondary | ICD-10-CM | POA: Diagnosis not present

## 2024-01-13 DIAGNOSIS — Z125 Encounter for screening for malignant neoplasm of prostate: Secondary | ICD-10-CM

## 2024-01-13 LAB — BASIC METABOLIC PANEL WITH GFR
BUN: 15 mg/dL (ref 6–23)
CO2: 29 meq/L (ref 19–32)
Calcium: 9.6 mg/dL (ref 8.4–10.5)
Chloride: 101 meq/L (ref 96–112)
Creatinine, Ser: 1.11 mg/dL (ref 0.40–1.50)
GFR: 77.07 mL/min (ref >60.00)
Glucose, Bld: 94 mg/dL (ref 70–99)
Potassium: 4.3 meq/L (ref 3.5–5.1)
Sodium: 136 meq/L (ref 135–145)

## 2024-01-13 LAB — CBC WITH DIFFERENTIAL/PLATELET
Basophils Absolute: 0 K/uL (ref 0.0–0.1)
Basophils Relative: 0.7 % (ref 0.0–3.0)
Eosinophils Absolute: 0.1 K/uL (ref 0.0–0.7)
Eosinophils Relative: 1.5 % (ref 0.0–5.0)
HCT: 43.8 % (ref 39.0–52.0)
Hemoglobin: 14.5 g/dL (ref 13.0–17.0)
Lymphocytes Relative: 35.1 % (ref 12.0–46.0)
Lymphs Abs: 2.2 K/uL (ref 0.7–4.0)
MCHC: 33.2 g/dL (ref 30.0–36.0)
MCV: 87.2 fl (ref 78.0–100.0)
Monocytes Absolute: 0.5 K/uL (ref 0.1–1.0)
Monocytes Relative: 7.4 % (ref 3.0–12.0)
Neutro Abs: 3.4 K/uL (ref 1.4–7.7)
Neutrophils Relative %: 55.3 % (ref 43.0–77.0)
Platelets: 195 K/uL (ref 150.0–400.0)
RBC: 5.02 Mil/uL (ref 4.22–5.81)
RDW: 13.3 % (ref 11.5–15.5)
WBC: 6.2 K/uL (ref 4.0–10.5)

## 2024-01-13 LAB — HEPATIC FUNCTION PANEL
ALT: 15 U/L (ref 0–53)
AST: 19 U/L (ref 0–37)
Albumin: 4.4 g/dL (ref 3.5–5.2)
Alkaline Phosphatase: 66 U/L (ref 39–117)
Bilirubin, Direct: 0.1 mg/dL (ref 0.0–0.3)
Total Bilirubin: 0.7 mg/dL (ref 0.2–1.2)
Total Protein: 7.7 g/dL (ref 6.0–8.3)

## 2024-01-13 LAB — LIPID PANEL
Cholesterol: 227 mg/dL — ABNORMAL HIGH (ref 0–200)
HDL: 44.4 mg/dL (ref >39.00)
LDL Cholesterol: 153 mg/dL — ABNORMAL HIGH (ref 0–99)
NonHDL: 182.96
Total CHOL/HDL Ratio: 5
Triglycerides: 150 mg/dL — ABNORMAL HIGH (ref 0.0–149.0)
VLDL: 30 mg/dL (ref 0.0–40.0)

## 2024-01-13 LAB — HEMOGLOBIN A1C: Hgb A1c MFr Bld: 6.4 % (ref 4.6–6.5)

## 2024-01-15 NOTE — Progress Notes (Signed)
 Hosey Burmester T. Agatha Duplechain, MD, CAQ Sports Medicine [facility] [address] [place], [address]  Phone: [phone]  FAX: [phone]  Ritik Stavola - 53 y.o. male  MRN [id number]  Date of Birth: [birth year]  Date: 01/18/2024  PCP: Watt Mirza, MD  Referral: Watt Mirza, MD  Chief Complaint  Patient presents with   Annual Exam   Patient Care Team: Watt Mirza, MD as PCP - General Subjective:   Bane Hagy is a 54 y.o. pleasant patient who presents with the following:  Discussed the use of AI scribe software for clinical note transcription with the patient, who gave verbal consent to proceed.  History of Present Illness Kassim Guertin is a 53 year old male who presents for an annual physical exam.  He has made significant lifestyle changes, notably reducing his [place] Dew intake from about two liters a day to almost none, substituting it with tea with less sugar. He is attempting to exercise more, although he finds it challenging due to a busy schedule with family commitments.  His liver function tests have normalized, which was a concern previously. He is scheduled to see an infectious disease doctor on October 8th for further evaluation.  Chronic hepatitis B  His blood sugar levels are elevated, with an A1c of 6.4. He has been attempting to lose weight.  He experiences gastrointestinal symptoms, suspecting lactose intolerance, as dairy products cause diarrhea. Eggs also seem to cause gastrointestinal distress, but he is fine when he avoids them.  He has a history of high cholesterol, which has been slightly elevated for many years. He was previously on cholesterol medication but is currently managing it non-pharmacologically.  He has two sons, aged 74 and 17, and mentions the challenges of maintaining a healthy lifestyle while managing family activities, including travel for soccer games. He has been trying to stay active by  riding his e-bike and participating in lawn care activities.    Preventative Health Maintenance Visit:  Health Maintenance Summary Reviewed and updated, unless pt declines services.  Tobacco History Reviewed. Alcohol: No concerns, no excessive use Exercise Habits: Not as much right now STD concerns: no risk or activity to increase risk Drug Use: None  Flu vaccine COVID booster  The 10-year ASCVD risk score (Arnett DK, et al., 2019) is: 7.2%   Values used to calculate the score:     Age: 25 years     Clincally relevant sex: Male     Is Non-Hispanic African American: No     Diabetic: No     Tobacco smoker: No     Systolic Blood Pressure: 142 mmHg     Is BP treated: Yes     HDL Cholesterol: 44.4 mg/dL     Total Cholesterol: 227 mg/dL   Health Maintenance  Topic Date Due   Pneumococcal Vaccine: 50+ Years (1 of 2 - PCV) Never done   Hepatitis B Vaccines 19-59 Average Risk (1 of 3 - 19+ 3-dose series) 11/11/1991   COVID-19 Vaccine (5 - 2025-26 season) 01/02/2024   DTaP/Tdap/Td (2 - Td or Tdap) 07/16/2025   Colonoscopy  05/10/2027   Influenza Vaccine  Completed   Hepatitis C Screening  Completed   HIV Screening  Completed   Zoster Vaccines- Shingrix   Completed   HPV VACCINES  Aged Out   Meningococcal B Vaccine  Aged Out   Immunization History  Administered Date(s) Administered   Hepatitis A, Adult 09/14/2022, 10/18/2022, 03/21/2023   Influenza, Seasonal,  Injecte, Preservative Fre 12/29/2022, 01/18/2024   Influenza,inj,Quad PF,6+ Mos 03/11/2014, 02/05/2019, 03/03/2020   Moderna Covid-19 Fall Seasonal Vaccine 75yrs & older 03/15/2022   PFIZER(Purple Top)SARS-COV-2 Vaccination 06/04/2019, 06/25/2019, 02/20/2020   PPD Test 03/01/2014, 08/17/2016   Tdap 07/17/2015   Zoster Recombinant(Shingrix ) 12/29/2022, 03/01/2023   Patient Active Problem List   Diagnosis Date Noted   Chronic hepatitis B (HCC) 07/24/2021    Priority: Medium    HYPERLIPIDEMIA 01/22/2008    Priority:  Medium    Essential hypertension 01/22/2008    Priority: Medium    Prediabetes 03/02/2020    Priority: Low   Allergic rhinitis due to pollen 11/06/2010    Priority: Low   Hemorrhoids 07/19/2022    Past Medical History:  Diagnosis Date   Allergic rhinitis due to pollen    Chronic hepatitis B (HCC) 07/24/2021   Hyperlipidemia    Hypertension     Past Surgical History:  Procedure Laterality Date   COLONOSCOPY WITH PROPOFOL  N/A 05/09/2020   Procedure: COLONOSCOPY WITH PROPOFOL ;  Surgeon: Therisa Bi, MD;  [facility];  Service: Gastroenterology;  Laterality: N/A;   WISDOM TOOTH EXTRACTION      Family History  Adopted: Yes    Social History   Social History Narrative   Not on file    Past Medical History, Surgical History, Social History, Family History, Problem List, Medications, and Allergies have been reviewed and updated if relevant.  Review of Systems: Pertinent positives are listed above.  Otherwise, a full 14 point review of systems has been done in full and it is negative except where it is noted positive.  Objective:   BP (!) 142/90   Pulse 72   Temp 97.7 F (36.5 C) (Temporal)   Ht 5' 7.25 (1.708 m)   Wt 229 lb 2 oz (103.9 kg)   SpO2 98%   BMI 35.62 kg/m  Ideal Body Weight: Weight in (lb) to have BMI = 25: 160.5  Ideal Body Weight: Weight in (lb) to have BMI = 25: 160.5 No results found.    01/18/2024   12:27 PM 12/29/2022    8:41 AM 07/19/2022    8:52 AM 01/21/2022   11:11 AM 10/06/2021    8:30 AM  Depression screen PHQ 2/9  Decreased Interest 0 0 0 0 0  Down, Depressed, Hopeless 0 0 0 0 0  PHQ - 2 Score 0 0 0 0 0  Difficult doing work/chores   Not difficult at all       GEN: well developed, well nourished, no acute distress Eyes: conjunctiva and lids normal, PERRLA, EOMI ENT: TM clear, nares clear, oral exam WNL Neck: supple, no lymphadenopathy, no thyromegaly, no JVD Pulm: clear to auscultation and percussion, respiratory effort  normal CV: regular rate and rhythm, S1-S2, no murmur, rub or gallop, no bruits, peripheral pulses normal and symmetric, no cyanosis, clubbing, edema or varicosities GI: soft, non-tender; no hepatosplenomegaly, masses; active bowel sounds all quadrants GU: deferred Lymph: no cervical, axillary or inguinal adenopathy MSK: gait normal, muscle tone and strength WNL, no joint swelling, effusions, discoloration, crepitus  SKIN: clear, good turgor, color WNL, no rashes, lesions, or ulcerations Neuro: normal mental status, normal strength, sensation, and motion Psych: alert; oriented to person, place and time, normally interactive and not anxious or depressed in appearance.  All labs reviewed with patient. Results for orders placed or performed in visit on 01/13/24  Lipid panel   Collection Time: 01/13/24 11:39 AM  Result Value Ref Range   Cholesterol  227 (H) 0 - 200 mg/dL   Triglycerides 849.9 (H) 0.0 - 149.0 mg/dL   HDL 55.59 >60.99 mg/dL   VLDL 69.9 0.0 - 59.9 mg/dL   LDL Cholesterol 846 (H) 0 - 99 mg/dL   Total CHOL/HDL Ratio 5    NonHDL 182.96   Hepatic function panel   Collection Time: 01/13/24 11:39 AM  Result Value Ref Range   Total Bilirubin 0.7 0.2 - 1.2 mg/dL   Bilirubin, Direct 0.1 0.0 - 0.3 mg/dL   Alkaline Phosphatase 66 39 - 117 U/L   AST 19 0 - 37 U/L   ALT 15 0 - 53 U/L   Total Protein 7.7 6.0 - 8.3 g/dL   Albumin 4.4 3.5 - 5.2 g/dL  Basic metabolic panel with GFR   Collection Time: 01/13/24 11:39 AM  Result Value Ref Range   Sodium 136 135 - 145 mEq/L   Potassium 4.3 3.5 - 5.1 mEq/L   Chloride 101 96 - 112 mEq/L   CO2 29 19 - 32 mEq/L   Glucose, Bld 94 70 - 99 mg/dL   BUN 15 6 - 23 mg/dL   Creatinine, Ser 8.88 0.40 - 1.50 mg/dL   GFR 22.92 >39.99 mL/min   Calcium  9.6 8.4 - 10.5 mg/dL  CBC with Differential/Platelet   Collection Time: 01/13/24 11:39 AM  Result Value Ref Range   WBC 6.2 4.0 - 10.5 K/uL   RBC 5.02 4.22 - 5.81 Mil/uL   Hemoglobin 14.5 13.0 -  17.0 g/dL   HCT 56.1 60.9 - 47.9 %   MCV 87.2 78.0 - 100.0 fl   MCHC 33.2 30.0 - 36.0 g/dL   RDW 86.6 88.4 - 84.4 %   Platelets 195.0 150.0 - 400.0 K/uL   Neutrophils Relative % 55.3 43.0 - 77.0 %   Lymphocytes Relative 35.1 12.0 - 46.0 %   Monocytes Relative 7.4 3.0 - 12.0 %   Eosinophils Relative 1.5 0.0 - 5.0 %   Basophils Relative 0.7 0.0 - 3.0 %   Neutro Abs 3.4 1.4 - 7.7 K/uL   Lymphs Abs 2.2 0.7 - 4.0 K/uL   Monocytes Absolute 0.5 0.1 - 1.0 K/uL   Eosinophils Absolute 0.1 0.0 - 0.7 K/uL   Basophils Absolute 0.0 0.0 - 0.1 K/uL  PSA, Total with Reflex to PSA, Free   Collection Time: 01/13/24 11:39 AM  Result Value Ref Range   PSA, Total 0.4 < OR = 4.0 ng/mL  Hemoglobin A1c   Collection Time: 01/13/24 11:39 AM  Result Value Ref Range   Hgb A1c MFr Bld 6.4 4.6 - 6.5 %    Assessment and Plan:     ICD-10-CM   1. Healthcare maintenance  Z00.00     2. Need for influenza vaccination  Z23 Flu vaccine trivalent PF, 6mos and older(Flulaval,Afluria,Fluarix,Fluzone)     Assessment & Plan Prediabetes A1c at 6.4%, below diabetes threshold. - Encouraged weight loss and dietary changes. - Emphasized reducing sugar intake and increasing physical activity.  Mixed hyperlipidemia Cholesterol slightly elevated, managed non-pharmacologically with 10-year cardiovascular risk at 6%. - Continue non-pharmacological management. - Reassess cholesterol levels and cardiovascular risk periodically. - Discussed statins and cardiovascular event risk.  Possible lactose intolerance Gastrointestinal symptoms after dairy and eggs, improved with avoidance. - Avoid dairy and eggs to assess symptom improvement.  Adult Wellness Visit Routine visit with discussion on health and lifestyle. - Encouraged weight loss and increased physical activity. - Administer flu vaccine. - Consider COVID booster based on risk factors.  Health  Maintenance Exam: The patient's preventative maintenance and recommended  screening tests for an annual wellness exam were reviewed in full today. Brought up to date unless services declined.  Counselled on the importance of diet, exercise, and its role in overall health and mortality. The patient's FH and SH was reviewed, including their home life, tobacco status, and drug and alcohol status.  Follow-up in 1 year for physical exam or additional follow-up below.  Disposition: No follow-ups on file.  No orders of the defined types were placed in this encounter.  There are no discontinued medications. Orders Placed This Encounter  Procedures   Flu vaccine trivalent PF, 6mos and older(Flulaval,Afluria,Fluarix,Fluzone)    Signed,  Keiera Strathman T. Trichelle Lehan, MD   Allergies as of 01/18/2024       Reactions   Shrimp Extract Hives   Ace Inhibitors    REACTION: cough   Hydrocodone         Medication List        Accurate as of January 18, 2024  2:08 PM. If you have any questions, ask your nurse or doctor.          amLODipine  10 MG tablet Commonly known as: NORVASC  Take 1 tablet by mouth once daily   fexofenadine 180 MG tablet Commonly known as: ALLEGRA Take 180 mg by mouth daily.   fluticasone  50 MCG/ACT nasal spray Commonly known as: FLONASE  Place 2 sprays into both nostrils as needed.   Vemlidy  25 MG tablet Generic drug: tenofovir  alafenamide TAKE 1 TABLET (25 MG TOTAL) BY MOUTH DAILY.

## 2024-01-16 LAB — PSA, TOTAL WITH REFLEX TO PSA, FREE: PSA, Total: 0.4 ng/mL (ref <=4.0)

## 2024-01-18 ENCOUNTER — Ambulatory Visit (INDEPENDENT_AMBULATORY_CARE_PROVIDER_SITE_OTHER): Admitting: Family Medicine

## 2024-01-18 ENCOUNTER — Encounter: Payer: Self-pay | Admitting: Family Medicine

## 2024-01-18 VITALS — BP 142/90 | HR 72 | Temp 97.7°F | Ht 67.25 in | Wt 229.1 lb

## 2024-01-18 DIAGNOSIS — E782 Mixed hyperlipidemia: Secondary | ICD-10-CM | POA: Diagnosis not present

## 2024-01-18 DIAGNOSIS — Z23 Encounter for immunization: Secondary | ICD-10-CM

## 2024-01-18 DIAGNOSIS — R7303 Prediabetes: Secondary | ICD-10-CM | POA: Diagnosis not present

## 2024-01-18 DIAGNOSIS — Z0000 Encounter for general adult medical examination without abnormal findings: Secondary | ICD-10-CM | POA: Diagnosis not present

## 2024-02-10 ENCOUNTER — Other Ambulatory Visit: Payer: Self-pay | Admitting: Gastroenterology

## 2024-02-10 DIAGNOSIS — B181 Chronic viral hepatitis B without delta-agent: Secondary | ICD-10-CM

## 2024-02-16 ENCOUNTER — Ambulatory Visit
Admission: RE | Admit: 2024-02-16 | Discharge: 2024-02-16 | Disposition: A | Discharge status: Home / Self Care | Source: Ambulatory Visit | Attending: Gastroenterology | Admitting: Gastroenterology

## 2024-02-16 DIAGNOSIS — B181 Chronic viral hepatitis B without delta-agent: Secondary | ICD-10-CM | POA: Diagnosis present

## 2024-04-07 ENCOUNTER — Other Ambulatory Visit: Payer: Self-pay | Admitting: Family Medicine

## 2024-06-08 ENCOUNTER — Encounter: Payer: Self-pay | Admitting: Family Medicine
